# Patient Record
Sex: Female | Born: 1945 | Race: White | Hispanic: No | Smoking: Former smoker
Health system: Southern US, Community
[De-identification: ages and names within clinical notes are randomized; demographics above are authoritative.]

## PROBLEM LIST (undated history)

## (undated) DIAGNOSIS — E785 Hyperlipidemia, unspecified: Secondary | ICD-10-CM

## (undated) HISTORY — PX: VEIN SURGERY: SHX48

## (undated) HISTORY — PX: JOINT REPLACEMENT: SHX530

## (undated) HISTORY — PX: OTHER SURGICAL HISTORY: SHX169

## (undated) HISTORY — DX: Hyperlipidemia, unspecified: E78.5

---

## 1997-12-27 ENCOUNTER — Other Ambulatory Visit: Admission: RE | Admit: 1997-12-27 | Discharge: 1997-12-27 | Payer: Self-pay | Admitting: Obstetrics & Gynecology

## 1999-02-14 ENCOUNTER — Encounter (INDEPENDENT_AMBULATORY_CARE_PROVIDER_SITE_OTHER): Payer: Self-pay

## 1999-02-14 ENCOUNTER — Other Ambulatory Visit: Admission: RE | Admit: 1999-02-14 | Discharge: 1999-02-14 | Payer: Self-pay | Admitting: Obstetrics and Gynecology

## 1999-02-19 ENCOUNTER — Encounter: Payer: Self-pay | Admitting: Obstetrics and Gynecology

## 1999-02-19 ENCOUNTER — Ambulatory Visit (HOSPITAL_COMMUNITY): Admission: RE | Admit: 1999-02-19 | Discharge: 1999-02-19 | Payer: Self-pay | Admitting: Obstetrics and Gynecology

## 2000-03-25 ENCOUNTER — Other Ambulatory Visit: Admission: RE | Admit: 2000-03-25 | Discharge: 2000-03-25 | Payer: Self-pay | Admitting: Obstetrics and Gynecology

## 2001-05-12 ENCOUNTER — Other Ambulatory Visit: Admission: RE | Admit: 2001-05-12 | Discharge: 2001-05-12 | Payer: Self-pay | Admitting: Obstetrics and Gynecology

## 2002-05-26 ENCOUNTER — Other Ambulatory Visit: Admission: RE | Admit: 2002-05-26 | Discharge: 2002-05-26 | Payer: Self-pay | Admitting: Obstetrics and Gynecology

## 2003-06-22 ENCOUNTER — Other Ambulatory Visit: Admission: RE | Admit: 2003-06-22 | Discharge: 2003-06-22 | Payer: Self-pay | Admitting: Obstetrics and Gynecology

## 2003-12-27 ENCOUNTER — Ambulatory Visit: Payer: Self-pay | Admitting: Family Medicine

## 2004-11-04 ENCOUNTER — Ambulatory Visit: Payer: Self-pay | Admitting: Internal Medicine

## 2004-11-12 ENCOUNTER — Ambulatory Visit: Payer: Self-pay | Admitting: Internal Medicine

## 2004-12-16 ENCOUNTER — Other Ambulatory Visit: Admission: RE | Admit: 2004-12-16 | Discharge: 2004-12-16 | Payer: Self-pay | Admitting: Obstetrics and Gynecology

## 2009-03-27 ENCOUNTER — Ambulatory Visit (HOSPITAL_BASED_OUTPATIENT_CLINIC_OR_DEPARTMENT_OTHER): Admission: RE | Admit: 2009-03-27 | Discharge: 2009-03-27 | Payer: Self-pay | Admitting: Orthopedic Surgery

## 2009-11-08 ENCOUNTER — Encounter
Admission: RE | Admit: 2009-11-08 | Discharge: 2009-12-06 | Payer: Self-pay | Source: Home / Self Care | Admitting: Family Medicine

## 2010-03-13 ENCOUNTER — Other Ambulatory Visit: Payer: Self-pay | Admitting: Family Medicine

## 2010-03-13 ENCOUNTER — Other Ambulatory Visit (HOSPITAL_COMMUNITY)
Admission: RE | Admit: 2010-03-13 | Discharge: 2010-03-13 | Disposition: A | Discharge status: Home / Self Care | Payer: BC Managed Care – PPO | Source: Ambulatory Visit | Attending: Family Medicine | Admitting: Family Medicine

## 2010-03-13 DIAGNOSIS — Z124 Encounter for screening for malignant neoplasm of cervix: Secondary | ICD-10-CM | POA: Insufficient documentation

## 2010-04-17 ENCOUNTER — Other Ambulatory Visit: Payer: Self-pay | Admitting: Family Medicine

## 2010-04-17 DIAGNOSIS — M5416 Radiculopathy, lumbar region: Secondary | ICD-10-CM

## 2010-04-19 ENCOUNTER — Ambulatory Visit
Admission: RE | Admit: 2010-04-19 | Discharge: 2010-04-19 | Disposition: A | Discharge status: Home / Self Care | Payer: BC Managed Care – PPO | Source: Ambulatory Visit | Attending: Family Medicine | Admitting: Family Medicine

## 2010-04-19 DIAGNOSIS — M5416 Radiculopathy, lumbar region: Secondary | ICD-10-CM

## 2010-04-19 MED ORDER — GADOBENATE DIMEGLUMINE 529 MG/ML IV SOLN
15.0000 mL | Freq: Once | INTRAVENOUS | Status: AC | PRN
  Administered 2010-04-19: 15 mL via INTRAVENOUS

## 2010-05-02 LAB — POCT HEMOGLOBIN-HEMACUE: Hemoglobin: 15.1 g/dL — ABNORMAL HIGH (ref 12.0–15.0)

## 2010-09-13 ENCOUNTER — Other Ambulatory Visit: Payer: Self-pay | Admitting: Orthopedic Surgery

## 2010-09-13 ENCOUNTER — Ambulatory Visit (HOSPITAL_COMMUNITY)
Admission: RE | Admit: 2010-09-13 | Discharge: 2010-09-13 | Disposition: A | Discharge status: Home / Self Care | Payer: BC Managed Care – PPO | Source: Ambulatory Visit | Attending: Orthopedic Surgery | Admitting: Orthopedic Surgery

## 2010-09-13 ENCOUNTER — Encounter (HOSPITAL_COMMUNITY): Payer: BC Managed Care – PPO

## 2010-09-13 ENCOUNTER — Other Ambulatory Visit (HOSPITAL_COMMUNITY): Payer: Self-pay | Admitting: *Deleted

## 2010-09-13 DIAGNOSIS — Z01818 Encounter for other preprocedural examination: Secondary | ICD-10-CM | POA: Insufficient documentation

## 2010-09-13 DIAGNOSIS — M169 Osteoarthritis of hip, unspecified: Secondary | ICD-10-CM | POA: Insufficient documentation

## 2010-09-13 DIAGNOSIS — M47817 Spondylosis without myelopathy or radiculopathy, lumbosacral region: Secondary | ICD-10-CM | POA: Insufficient documentation

## 2010-09-13 DIAGNOSIS — M1610 Unilateral primary osteoarthritis, unspecified hip: Secondary | ICD-10-CM | POA: Insufficient documentation

## 2010-09-13 DIAGNOSIS — Z01812 Encounter for preprocedural laboratory examination: Secondary | ICD-10-CM | POA: Insufficient documentation

## 2010-09-13 DIAGNOSIS — M25559 Pain in unspecified hip: Secondary | ICD-10-CM | POA: Insufficient documentation

## 2010-09-13 LAB — COMPREHENSIVE METABOLIC PANEL
ALT: 24 U/L (ref 0–35)
AST: 19 U/L (ref 0–37)
CO2: 26 mEq/L (ref 19–32)
Calcium: 9.9 mg/dL (ref 8.4–10.5)
Chloride: 102 mEq/L (ref 96–112)
GFR calc non Af Amer: >60 mL/min (ref >60)
Potassium: 4.1 mEq/L (ref 3.5–5.1)
Sodium: 138 mEq/L (ref 135–145)
Total Bilirubin: 0.3 mg/dL (ref 0.3–1.2)

## 2010-09-13 LAB — URINALYSIS, ROUTINE W REFLEX MICROSCOPIC
Bilirubin Urine: NEGATIVE
Glucose, UA: NEGATIVE mg/dL
Hgb urine dipstick: NEGATIVE
Specific Gravity, Urine: 1.012 (ref 1.005–1.030)
pH: 6.5 (ref 5.0–8.0)

## 2010-09-13 LAB — CBC
HCT: 41.6 % (ref 36.0–46.0)
MCHC: 33.7 g/dL (ref 30.0–36.0)
MCV: 94.3 fL (ref 78.0–100.0)
RDW: 13.4 % (ref 11.5–15.5)

## 2010-09-13 LAB — URINE MICROSCOPIC-ADD ON

## 2010-09-23 ENCOUNTER — Inpatient Hospital Stay (HOSPITAL_COMMUNITY)
Admission: RE | Admit: 2010-09-23 | Discharge: 2010-09-26 | DRG: 818 | Disposition: A | Discharge status: Home Health | Payer: BC Managed Care – PPO | Source: Ambulatory Visit | Attending: Orthopedic Surgery | Admitting: Orthopedic Surgery

## 2010-09-23 ENCOUNTER — Inpatient Hospital Stay (HOSPITAL_COMMUNITY): Payer: BC Managed Care – PPO

## 2010-09-23 DIAGNOSIS — M1610 Unilateral primary osteoarthritis, unspecified hip: Principal | ICD-10-CM | POA: Diagnosis present

## 2010-09-23 DIAGNOSIS — Z01812 Encounter for preprocedural laboratory examination: Secondary | ICD-10-CM

## 2010-09-23 DIAGNOSIS — R030 Elevated blood-pressure reading, without diagnosis of hypertension: Secondary | ICD-10-CM | POA: Diagnosis not present

## 2010-09-23 DIAGNOSIS — M169 Osteoarthritis of hip, unspecified: Principal | ICD-10-CM | POA: Diagnosis present

## 2010-09-23 DIAGNOSIS — Z791 Long term (current) use of non-steroidal anti-inflammatories (NSAID): Secondary | ICD-10-CM

## 2010-09-23 LAB — ABO/RH: ABO/RH(D): B POS

## 2010-09-23 LAB — TYPE AND SCREEN: ABO/RH(D): B POS

## 2010-09-24 LAB — BASIC METABOLIC PANEL
BUN: 10 mg/dL (ref 6–23)
Chloride: 97 mEq/L (ref 96–112)
GFR calc Af Amer: >60 mL/min (ref >60)
Potassium: 4 mEq/L (ref 3.5–5.1)

## 2010-09-24 LAB — CBC
HCT: 34 % — ABNORMAL LOW (ref 36.0–46.0)
RDW: 13.3 % (ref 11.5–15.5)
WBC: 10.4 10*3/uL (ref 4.0–10.5)

## 2010-09-25 LAB — BASIC METABOLIC PANEL
BUN: 8 mg/dL (ref 6–23)
CO2: 28 mEq/L (ref 19–32)
Chloride: 104 mEq/L (ref 96–112)
Glucose, Bld: 124 mg/dL — ABNORMAL HIGH (ref 70–99)
Potassium: 4.1 mEq/L (ref 3.5–5.1)

## 2010-09-25 LAB — CBC
HCT: 30.3 % — ABNORMAL LOW (ref 36.0–46.0)
Hemoglobin: 10.4 g/dL — ABNORMAL LOW (ref 12.0–15.0)
MCHC: 34.3 g/dL (ref 30.0–36.0)
RBC: 3.2 MIL/uL — ABNORMAL LOW (ref 3.87–5.11)
WBC: 9.9 10*3/uL (ref 4.0–10.5)

## 2010-09-25 NOTE — H&P (Signed)
  [name] NO.:  [id number]  MEDICAL RECORD NO.:  [id number]  LOCATION:                                 FACILITY:  PHYSICIAN:  Ollen Gross, M.D.    DATE OF BIRTH:  11-05-1945  DATE OF ADMISSION:  09/23/2010 DATE OF DISCHARGE:                             HISTORY & PHYSICAL   CHIEF COMPLAINT:  Right hip pain.  HISTORY OF PRESENT ILLNESS:  The patient is a 65 year old female who has been seen by Dr. Lequita Halt for ongoing right hip pain.  She has known end- stage arthritis, pain has progressively gotten worse, but she would benefit from undergoing surgical intervention.  Risks and benefits have been discussed and she subsequently admitted to the hospital.  ALLERGIES:  No known drug allergies.  CURRENT MEDICATIONS:  Tylenol and Aleve.  PAST MEDICAL HISTORY:  Mild borderline hypertension.  PAST SURGICAL HISTORY:  Carpal tunnel surgery, varicose vein surgery.  FAMILY HISTORY:  Father deceased in his 31s, natural causes.  Mother deceased in her 32s, natural causes.  SOCIAL HISTORY:  Widow, retired, past smoker, 2 glasses of wine daily, 4 children, lives alone.  She will have caregiver lined up.  She does have a living will health care power of attorney.  REVIEW OF SYSTEMS:  GENERAL:  No fever, chills, or night sweats.  NEURO: No seizure, syncope, or paralysis.  RESPIRATORY:  No shortness of breath, productive cough, or hemoptysis.  CARDIOVASCULAR:  No chest pain, angina, or orthopnea.  GI:  No nausea, vomiting, diarrhea, or constipation.  GU:  No dysuria, hematuria, or discharge. MUSCULOSKELETAL:  Hip pain.  PHYSICAL EXAMINATION:  VITAL SIGNS:  Pulse 68, respirations 12, blood pressure 152/82. GENERAL:  A 65 year old white female, well nourished, well developed, in no acute distress.  She is alert, oriented, cooperative, pleasant, good historian. HEENT:  Normocephalic, atraumatic.  Pupils are round and reactive.  EOMs intact.  She does have  contacts. NECK:  Supple. CHEST:  Clear anterior posterior chest walls. HEART:  Regular rate and rhythm without murmur. ABDOMEN:  Soft, nontender.  Bowel sounds present. RECTAL/BREASTS/GENITALIA:  Not done, not pertinent to present illness. EXTREMITIES:  Right hip flexion with internal rotation 0, external rotation 10, abduction about 10-20 degrees.  Left hip, flexion 110 internal rotation 20, external rotation 30, abduction 30.  IMPRESSION:  Osteoarthritis, right hip.  PLAN:  The patient admitted to [facility] to undergo right total hip replacement arthroplasty.  Surgery will be performed by Dr. Ollen Gross.     Alexzandrew L. Julien Girt, P.A.C.   ______________________________ Ollen Gross, M.D.    ALP/MEDQ  D:  09/22/2010  T:  09/22/2010  Job:  [id number]  cc:   Otilio Connors. Gerri Spore, M.D. Fax: [phone]  Electronically Signed by Patrica Duel P.A.C. on 09/24/2010 10:12:29 AM Electronically Signed by Ollen Gross M.D. on 09/25/2010 10:30:31 AM

## 2010-09-25 NOTE — Op Note (Signed)
[name], [name] NO.:  [id number]  MEDICAL RECORD NO.:  [id number]  LOCATION:  1610                         FACILITY:  [facility]  PHYSICIAN:  Ollen Gross, M.D.    DATE OF BIRTH:  [birth year]  DATE OF PROCEDURE:  09/23/2010 DATE OF DISCHARGE:                              OPERATIVE REPORT   PREOPERATIVE DIAGNOSIS:  Osteoarthritis, right hip.  POSTOPERATIVE DIAGNOSIS:  Osteoarthritis, right hip.  PROCEDURE:  Right total hip arthroplasty.  SURGEON:  Ollen Gross, M.D.  ASSISTANT:  Alexzandrew L. Perkins, P.A.C.  ANESTHESIA:  Spinal.  ESTIMATED BLOOD LOSS:  200 during Hemovac x1.  COMPLICATIONS:  None.  CONDITION:  Stable to the recovery room.  BRIEF CLINICAL NOTE:  [name] is a 65 year old female with end-stage arthritis of the right hip with progressively worsening pain and dysfunction.  She has failed nonoperative management and presents for right total hip arthroplasty.  She has bone-on-bone arthritis of the hip with intractable pain and dysfunction.  PROCEDURE IN DETAIL:  After successful administration of spinal anesthetic, the patient was placed in the left lateral decubitus position with the right side up and held with the hip positioner.  Right lower extremity was isolated from her perineum with plastic drapes and prepped and draped in the usual sterile fashion.  Short posterolateral incision was made with a #10-blade through the subcutaneous tissue to the level of fascia lata, which was incised in line with the skin incision.  The sciatic nerve was palpated and protected and the short rotators and capsule were isolated off the femur.  Hip is dislocated and the center of femoral head is marked.  A trial prosthesis is placed such that the center of the trial head corresponds to the center of her native femoral head.  Osteotomy line is marked on the femoral neck and osteotomy made with an oscillating saw.  The femoral head is  then removed.  Femoral retractors are placed to gain access to the proximal femur.  Starter reamer was passed into the femoral canal.  The canal was thoroughly irrigated to remove fatty contents.  Axial reaming is then performed at 13.5 mm.  Proximal reaming is performed to an 60F and the sleeve is machined to a small.  An 60F small trial sleeve was placed.  The femur was then retracted anteriorly to gain acetabular exposure. Acetabular retractors were placed and labrum and osteophytes are removed.  Acetabular reaming is performed up to 51 mm for placement of a 52-mm pinnacle acetabular shell.  This was placed in an anatomic position with outstanding purchase.  We did not need dome screws.  Apex hole eliminator was placed, and a 32-mm neutral +4 marathon liner was placed.  The trial stem is then placed, which is an 18 x 30 with a 36 +8 neck matching native anteversion.  A 32 +0 trial head is placed.  Hips reduced with outstanding stability.  As full extension, flexion, rotation, 70 degrees of flexion, 40 degrees of adduction, 90 degrees of internal rotation, 90 degrees of flexion, and 70 degrees of internal rotation.  By placing the right leg on top of the left, it felt as though leg lengths were equal.  Hips  then dislocated and trials were removed.  The permanent 46F small sleeve was placed with the 18 x 30 stem and 36 +8 neck matching native anteversion.  The 32 +0 ceramic head is placed.  The hips reduced with the same stability parameters.  The wounds copiously irrigated with saline solution and then the short rotators and capsule reattached to the femur through drill holes with Ethibond suture.  Fascia lata was closed over Hemovac drain with interrupted #1 Vicryl.  The Exparel is then mixed and 50 mL of saline and injected into the fascia lata, gluteal muscles, and the subcutaneous tissues.  Subcu was then closed with interrupted #1 and 2-0 Vicryl and subcuticular running 4-0  Monocryl.  Drains are to suction.  Incision is clean and dry and Steri-Strips and a bulky sterile dressing applied. She was then placed into a knee immobilizer, awakened and transported to recovery room in stable condition.     Ollen Gross, M.D.     FA/MEDQ  D:  09/23/2010  T:  09/24/2010  Job:  [id number]  Electronically Signed by Ollen Gross M.D. on 09/25/2010 10:30:33 AM

## 2010-09-26 LAB — CBC
HCT: 30.9 % — ABNORMAL LOW (ref 36.0–46.0)
Hemoglobin: 10.5 g/dL — ABNORMAL LOW (ref 12.0–15.0)
MCH: 31.9 pg (ref 26.0–34.0)
MCHC: 34 g/dL (ref 30.0–36.0)

## 2010-10-20 NOTE — Discharge Summary (Signed)
[name], [name] NO.:  [id number]  MEDICAL RECORD NO.:  [id number]  LOCATION:  1610                         FACILITY:  [facility]  PHYSICIAN:  Ollen Gross, M.D.    DATE OF BIRTH:  04-Sep-1945  DATE OF ADMISSION:  09/23/2010 DATE OF DISCHARGE:  09/26/2010                              DISCHARGE SUMMARY   ADMITTING DIAGNOSES: 1. Osteoarthritis, right hip. 2. Mild borderline hypertension.  DISCHARGE DIAGNOSES: 1. Osteoarthritis, right hip; status post right total hip replacement     and arthroplasty. 2. Postoperative hyponatremia improved. 3. Mild borderline hypertension.  PROCEDURE:  September 23, 2010, right total hip.  SURGEON:  Ollen Gross, M.D.  ASSISTANT:  Alexzandrew L. Perkins, P.A.C.  ANESTHESIA:  Spinal.  CONSULTS:  None.  BRIEF HISTORY:  The patient is a 65 year old female with end-stage arthritis of the right hip, progressive worsening pain and dysfunction. She has failed nonoperative management, has bone-on-bone arthritis, now presents for total hip arthroplasty.  LABORATORY DATA:  The admission CBC is not scanned into the chart, but the starting hemoglobin was 14.  Serial CBC were followed.  Hemoglobin dropped down to 11.9 and 10.4, was stabilized.  Last H and H were 10.5 and 30.9.  Chem panel on admission not scanned to the chart, not available; but the postop sodium was down to 131 and it is back up to 138.  Remaining electrolytes remained within normal limits.  Glucose was 195 on postop day #1, back down to 124 on postop day #2.  Blood group type B+.  X-rays, postop films of the right hip and the pelvis shows satisfactory right total hip arthroplasty.  No adverse features.  HOSPITAL COURSE:  The patient admitted to [facility], taken to OR, underwent above-stated procedure without complication.  The patient tolerated the procedure well, later transferred to recovery room on orthopedic floor, started on p.o. and IV analgesic  for pain control following surgery given 24 hours postop IV antibiotics.  She was started on Xarelto for DVT prophylaxis.  A little low sodium on the morning of day 1, she had some clamping also which encouraged muscle relaxants. She started back on her home meds.  Her pressure was a little soft, but she was asymptomatic with this, we supported her with fluids.  She had decent urinary output.  Hemovac drain was pulled on day 1.  By day 2, she had already started getting up with therapy.  We switched her meds around.  She was doing a little bit better on oral Dilaudid.  Hemoglobin was stable.  Sodium was already back up.  Dressing changed.  Incision looked good.  Continued to progress with physical therapy; and by day 3, she was doing well, meeting her goals and discharged home.  DISCHARGE/PLAN: 1. The patient was discharged home on September 26, 2010. 2. Discharge diagnosis:  Please see above. 3. Discharge medications:  Dilaudid, Robaxin, and Xarelto.  She     stopped all of her other home meds including Aleve and Advil.  ACTIVITY:  She is partial weightbearing 25-50% to the right lower extremity.  Hip precautions, total hip protocol.  FOLLOWUP:  2 weeks.  DISPOSITION:  Home.  CONDITION UPON DISCHARGE:  Improving.     Alexzandrew L. Julien Girt, P.A.C.   ______________________________ Ollen Gross, M.D.    ALP/MEDQ  D:  10/16/2010  T:  10/16/2010  Job:  [id number]  cc:   Otilio Connors. Gerri Spore, M.D. Fax: [phone]  Electronically Signed by Patrica Duel P.A.C. on 10/17/2010 08:14:26 AM Electronically Signed by Ollen Gross M.D. on 10/20/2010 12:16:38 PM

## 2011-10-21 ENCOUNTER — Emergency Department (HOSPITAL_COMMUNITY)
Admission: EM | Admit: 2011-10-21 | Discharge: 2011-10-21 | Disposition: A | Discharge status: Home / Self Care | Payer: Medicare Other | Attending: Emergency Medicine | Admitting: Emergency Medicine

## 2011-10-21 ENCOUNTER — Encounter (HOSPITAL_COMMUNITY): Payer: Self-pay | Admitting: Emergency Medicine

## 2011-10-21 DIAGNOSIS — Z7982 Long term (current) use of aspirin: Secondary | ICD-10-CM | POA: Insufficient documentation

## 2011-10-21 DIAGNOSIS — T7840XA Allergy, unspecified, initial encounter: Secondary | ICD-10-CM

## 2011-10-21 DIAGNOSIS — T6391XA Toxic effect of contact with unspecified venomous animal, accidental (unintentional), initial encounter: Secondary | ICD-10-CM | POA: Insufficient documentation

## 2011-10-21 DIAGNOSIS — T63461A Toxic effect of venom of wasps, accidental (unintentional), initial encounter: Secondary | ICD-10-CM | POA: Insufficient documentation

## 2011-10-21 MED ORDER — PREDNISONE 20 MG PO TABS: 60.0000 mg | ORAL_TABLET | Freq: Every day | ORAL | Status: AC

## 2011-10-21 MED ORDER — EPINEPHRINE 0.3 MG/0.3ML IJ DEVI: 0.3000 mg | INTRAMUSCULAR | Status: DC | PRN

## 2011-10-21 NOTE — ED Provider Notes (Signed)
History     CSN: [id number]  Arrival date & time 10/21/11  1238   First MD Initiated Contact with Patient 10/21/11 1429      Chief Complaint  Patient presents with  . Allergic Reaction    (Consider location/radiation/quality/duration/timing/severity/associated sxs/prior treatment) Patient is a 66 y.o. female presenting with allergic reaction. The history is provided by the patient.  Allergic Reaction The primary symptoms are  rash. The primary symptoms do not include shortness of breath, cough, nausea, vomiting or palpitations.   66 year old, female was stung right hand by a hornet.  She develops hives, itching in her extremities, and swelling of the right hand.  She went to her primary care doctor's office and was given Benadryl, and a steroid.  The hives have resolved, and the itching, has resolved.  She did state that after she was stung.  She was a little bit lightheaded.  However, she denied shortness of breath.  She denied tightness of her throat, nausea, or vomiting she has never had an allergic reaction to an insect in the past.  Presently, she is asymptomatic  History reviewed. No pertinent past medical history.  History reviewed. No pertinent past surgical history.  No family history on file.  History  Substance Use Topics  . Smoking status: Not on file  . Smokeless tobacco: Not on file  . Alcohol Use: Not on file    OB History    Grav Para Term Preterm Abortions TAB SAB Ect Mult Living                  Review of Systems  HENT: Negative for trouble swallowing and voice change.   Respiratory: Negative for cough and shortness of breath.   Cardiovascular: Negative for palpitations.  Gastrointestinal: Negative for nausea and vomiting.  Skin: Positive for rash.  Neurological: Positive for light-headedness.  All other systems reviewed and are negative.    Allergies  Review of patient's allergies indicates no known allergies.  Home Medications   Current  Outpatient Rx  Name Route Sig Dispense Refill  . ASPIRIN EC 81 MG PO TBEC Oral Take 81 mg by mouth daily.    Marland Kitchen CALCIUM CARBONATE 1250 MG PO CAPS Oral Take 1,250 mg by mouth daily.    . OMEGA-3 FATTY ACIDS 1000 MG PO CAPS Oral Take 2 g by mouth daily.    . ADULT MULTIVITAMIN W/MINERALS CH Oral Take 1 tablet by mouth daily.    Marland Kitchen PROBIOTIC & ACIDOPHILUS EX ST PO Oral Take 1 tablet by mouth daily.      BP 150/89  Pulse 75  Temp 98.2 F (36.8 C) (Oral)  Resp 16  SpO2 95%  Physical Exam  Nursing note and vitals reviewed. Constitutional: She is oriented to person, place, and time. She appears well-developed and well-nourished. No distress.  HENT:  Head: Normocephalic and atraumatic.  Mouth/Throat: Oropharynx is clear and moist.  Eyes: Conjunctivae are normal.  Neck: Normal range of motion. Neck supple.  Cardiovascular: Normal rate.   No murmur heard. Pulmonary/Chest: Effort normal and breath sounds normal. She has no wheezes.  Musculoskeletal: Normal range of motion.  Neurological: She is alert and oriented to person, place, and time.  Skin: Skin is warm and dry. No rash noted.  Psychiatric: She has a normal mood and affect. Thought content normal.    ED Course  Procedures (including critical care time) allergic  reaction to insect sting.  No signs of anaphylaxis.  Symptoms resolved after treatment with steroids,  and Benadryl  Labs Reviewed - No data to display No results found.   No diagnosis found.    MDM  Allergic reaction        Cheri Guppy, MD 10/21/11 [phone]

## 2011-10-21 NOTE — ED Notes (Signed)
Voiced understanding of instructions given 

## 2011-10-21 NOTE — ED Notes (Signed)
PER EMS- pt picked up from Dr office with c/o allergic reaction to "hornet type" of bug.  Pt reports being stung around 11a on R hand.  Hand is red and swollen presently.  Prior to pt arrival to dr's office she took benadryl, however she unaware of the dosage.  At dr's office pt was given 50mg  of Benadryl and "depo-medrol"  80mg .  Pt arrived to EMS alert and oriented and in no respiratory distress.

## 2011-10-21 NOTE — ED Notes (Signed)
ZOX:[id number]<AV> Expected date:10/21/11<BR> Expected time:12:29 PM<BR> Means of arrival:Ambulance<BR> Comments:<BR> 64yoF,allergic reaction/hives

## 2014-10-05 ENCOUNTER — Encounter: Payer: Self-pay | Admitting: Internal Medicine

## 2014-12-05 ENCOUNTER — Ambulatory Visit: Payer: Self-pay | Admitting: Orthopedic Surgery

## 2014-12-05 NOTE — Progress Notes (Signed)
Preoperative surgical orders have been place into the Epic hospital system for [name] on 12/05/2014, 5:33 PM  by Patrica DuelPERKINS, Aubree Doody for surgery on 12-27-14.  Preop Total Hip - Anterior Approach orders including IV Tylenol, and IV Decadron as long as there are no contraindications to the above medications. Claire Peacerew Consetta Cosner, PA-C

## 2014-12-15 NOTE — Patient Instructions (Addendum)
[name]  12/18/2014   Your procedure is scheduled on:   12/26/2016    [facility]  [facility]  [facility] to 3rd floor to  Short Stay Center at    130pm  Call this number if you have problems the morning of surgery [phone]   Remember: ONLY 1 PERSON MAY GO WITH YOU TO SHORT STAY TO GET  READY MORNING OF YOUR SURGERY.  Do not eat food after midnite.  May have clear liquids from 12 midnite until 0900am then nothing by mouth.       Take these medicines the morning of surgery with A SIP OF WATER: none                                You may not have any metal on your body including hair pins and              piercings  Do not wear jewelry, make-up, lotions, powders or perfumes, deodorant             Do not wear nail polish.  Do not shave  48 hours prior to surgery.               Do not bring valuables to the hospital. [facility] IS NOT             RESPONSIBLE   FOR VALUABLES.  Contacts, dentures or bridgework may not be worn into surgery.  Leave suitcase in the car. After surgery it may be brought to your room.       Special Instructions: coughing and deep breathing exercises               Please read over the following fact sheets you were given: _____________________________________________________________________             [facility] - Preparing for [facility] Before surgery, you can play an important role.  Because skin is not sterile, your skin needs to be as free of germs as possible.  You can reduce the number of germs on your skin by washing with CHG (chlorahexidine gluconate) soap before surgery.  CHG is an antiseptic cleaner which kills germs and bonds with the skin to continue killing germs even after washing. Please DO NOT use if you have an allergy to CHG or antibacterial soaps.  If your skin becomes reddened/irritated stop using the CHG and inform your nurse when you arrive at Short Stay. Do not shave  (including legs and underarms) for at least 48 hours prior to the first CHG shower.  You may shave your face/neck. Please follow these instructions carefully:  1.  Shower with CHG Soap the night before surgery and the  morning of Surgery.  2.  If you choose to wash your hair, wash your hair first as usual with your  normal  shampoo.  3.  After you shampoo, rinse your hair and body thoroughly to remove the  shampoo.                           4.  Use CHG as you would any other liquid soap.  You can apply chg directly  to the skin and wash  Gently with a scrungie or clean washcloth.  5.  Apply the CHG Soap to your body ONLY FROM THE NECK DOWN.   Do not use on face/ open                           Wound or open sores. Avoid contact with eyes, ears mouth and genitals (private parts).                       Wash face,  Genitals (private parts) with your normal soap.             6.  Wash thoroughly, paying special attention to the area where your surgery  will be performed.  7.  Thoroughly rinse your body with warm water from the neck down.  8.  DO NOT shower/wash with your normal soap after using and rinsing off  the CHG Soap.                9.  Pat yourself dry with a clean towel.            10.  Wear clean pajamas.            11.  Place clean sheets on your bed the night of your first shower and do not  sleep with pets. Day of Surgery : Do not apply any lotions/deodorants the morning of surgery.  Please wear clean clothes to the hospital/surgery center.  FAILURE TO FOLLOW THESE INSTRUCTIONS MAY RESULT IN THE CANCELLATION OF YOUR SURGERY PATIENT SIGNATURE_________________________________  NURSE SIGNATURE__________________________________  ________________________________________________________________________    CLEAR LIQUID DIET   Foods Allowed                                                                     Foods Excluded  Coffee and tea, regular and decaf                              liquids that you cannot  Plain Jell-O in any flavor                                             see through such as: Fruit ices (not with fruit pulp)                                     milk, soups, orange juice  Iced Popsicles                                    All solid food Carbonated beverages, regular and diet                                    Cranberry, grape and apple juices Sports drinks like Gatorade Lightly seasoned clear broth or consume(fat free) Sugar, honey syrup  Sample Menu Breakfast                                Lunch                                     Supper Cranberry juice                    Beef broth                            Chicken broth Jell-O                                     Grape juice                           Apple juice Coffee or tea                        Jell-O                                      Popsicle                                                Coffee or tea                        Coffee or tea  _____________________________________________________________________   WHAT IS A BLOOD TRANSFUSION? Blood Transfusion Information  A transfusion is the replacement of blood or some of its parts. Blood is made up of multiple cells which provide different functions.  Red blood cells carry oxygen and are used for blood loss replacement.  White blood cells fight against infection.  Platelets control bleeding.  Plasma helps clot blood.  Other blood products are available for specialized needs, such as hemophilia or other clotting disorders. BEFORE THE TRANSFUSION  Who gives blood for transfusions?   Healthy volunteers who are fully evaluated to make sure their blood is safe. This is blood bank blood. Transfusion therapy is the safest it has ever been in the practice of medicine. Before blood is taken from a donor, a complete history is taken to make sure that person has no history of diseases nor engages in risky social behavior  (examples are intravenous drug use or sexual activity with multiple partners). The donor's travel history is screened to minimize risk of transmitting infections, such as malaria. The donated blood is tested for signs of infectious diseases, such as HIV and hepatitis. The blood is then tested to be sure it is compatible with you in order to minimize the chance of a transfusion reaction. If you or a relative donates blood, this is often done in anticipation of surgery and is not appropriate for emergency situations. It takes many days to process the donated blood. RISKS AND COMPLICATIONS Although transfusion therapy is very safe and saves many lives, the main dangers of transfusion include:  1. Getting an infectious disease. 2. Developing a transfusion reaction. This is an allergic reaction to something in the blood you were given. Every precaution is taken to prevent this. The decision to have a blood transfusion has been considered carefully by your caregiver before blood is given. Blood is not given unless the benefits outweigh the risks. AFTER THE TRANSFUSION  Right after receiving a blood transfusion, you will usually feel much better and more energetic. This is especially true if your red blood cells have gotten low (anemic). The transfusion raises the level of the red blood cells which carry oxygen, and this usually causes an energy increase.  The nurse administering the transfusion will monitor you carefully for complications. HOME CARE INSTRUCTIONS  No special instructions are needed after a transfusion. You may find your energy is better. Speak with your caregiver about any limitations on activity for underlying diseases you may have. SEEK MEDICAL CARE IF:   Your condition is not improving after your transfusion.  You develop redness or irritation at the intravenous (IV) site. SEEK IMMEDIATE MEDICAL CARE IF:  Any of the following symptoms occur over the next 12 hours:  Shaking  chills.  You have a temperature by mouth above 102 F (38.9 C), not controlled by medicine.  Chest, back, or muscle pain.  People around you feel you are not acting correctly or are confused.  Shortness of breath or difficulty breathing.  Dizziness and fainting.  You get a rash or develop hives.  You have a decrease in urine output.  Your urine turns a dark color or changes to pink, red, or brown. Any of the following symptoms occur over the next 10 days:  You have a temperature by mouth above 102 F (38.9 C), not controlled by medicine.  Shortness of breath.  Weakness after normal activity.  The white part of the eye turns yellow (jaundice).  You have a decrease in the amount of urine or are urinating less often.  Your urine turns a dark color or changes to pink, red, or brown. Document Released: 01/25/2000 Document Revised: 04/21/2011 Document Reviewed: 09/13/2007 ExitCare Patient Information 2014 [place].  _______________________________________________________________________  Incentive Spirometer  An incentive spirometer is a tool that can help keep your lungs clear and active. This tool measures how well you are filling your lungs with each breath. Taking long deep breaths may help reverse or decrease the chance of developing breathing (pulmonary) problems (especially infection) following:  A long period of time when you are unable to move or be active. BEFORE THE PROCEDURE   If the spirometer includes an indicator to show your best effort, your nurse or respiratory therapist will set it to a desired goal.  If possible, sit up straight or lean slightly forward. Try not to slouch.  Hold the incentive spirometer in an upright position. INSTRUCTIONS FOR USE  3. Sit on the edge of your bed if possible, or sit up as far as you can in bed or on a chair. 4. Hold the incentive spirometer in an upright position. 5. Breathe out normally. 6. Place the  mouthpiece in your mouth and seal your lips tightly around it. 7. Breathe in slowly and as deeply as possible, raising the piston or the ball toward the top of the column. 8. Hold your breath for 3-5 seconds or for as long as possible. Allow the piston or ball to fall to the bottom of the column. 9. Remove the mouthpiece from your mouth and breathe out  normally. 10. Rest for a few seconds and repeat Steps 1 through 7 at least 10 times every 1-2 hours when you are awake. Take your time and take a few normal breaths between deep breaths. 11. The spirometer may include an indicator to show your best effort. Use the indicator as a goal to work toward during each repetition. 12. After each set of 10 deep breaths, practice coughing to be sure your lungs are clear. If you have an incision (the cut made at the time of surgery), support your incision when coughing by placing a pillow or rolled up towels firmly against it. Once you are able to get out of bed, walk around indoors and cough well. You may stop using the incentive spirometer when instructed by your caregiver.  RISKS AND COMPLICATIONS  Take your time so you do not get dizzy or light-headed.  If you are in pain, you may need to take or ask for pain medication before doing incentive spirometry. It is harder to take a deep breath if you are having pain. AFTER USE  Rest and breathe slowly and easily.  It can be helpful to keep track of a log of your progress. Your caregiver can provide you with a simple table to help with this. If you are using the spirometer at home, follow these instructions: [place] IF:   You are having difficultly using the spirometer.  You have trouble using the spirometer as often as instructed.  Your pain medication is not giving enough relief while using the spirometer.  You develop fever of 100.5 F (38.1 C) or higher. SEEK IMMEDIATE MEDICAL CARE IF:   You cough up bloody sputum that had not been  present before.  You develop fever of 102 F (38.9 C) or greater.  You develop worsening pain at or near the incision site. MAKE SURE YOU:   Understand these instructions.  Will watch your condition.  Will get help right away if you are not doing well or get worse. Document Released: 06/09/2006 Document Revised: 04/21/2011 Document Reviewed: 08/10/2006 [facility] Patient Information 2014 [place], [place].   ________________________________________________________________________

## 2014-12-18 ENCOUNTER — Encounter (HOSPITAL_COMMUNITY)
Admission: RE | Admit: 2014-12-18 | Discharge: 2014-12-18 | Disposition: A | Discharge status: Home / Self Care | Payer: Medicare Other | Source: Ambulatory Visit | Attending: Orthopedic Surgery | Admitting: Orthopedic Surgery

## 2014-12-18 ENCOUNTER — Encounter (HOSPITAL_COMMUNITY): Payer: Self-pay

## 2014-12-18 DIAGNOSIS — Z01818 Encounter for other preprocedural examination: Secondary | ICD-10-CM | POA: Insufficient documentation

## 2014-12-18 DIAGNOSIS — M1612 Unilateral primary osteoarthritis, left hip: Secondary | ICD-10-CM | POA: Insufficient documentation

## 2014-12-18 LAB — SURGICAL PCR SCREEN
MRSA, PCR: NEGATIVE
STAPHYLOCOCCUS AUREUS: NEGATIVE

## 2014-12-18 LAB — COMPREHENSIVE METABOLIC PANEL
ALT: 31 U/L (ref 14–54)
AST: 31 U/L (ref 15–41)
Albumin: 4.5 g/dL (ref 3.5–5.0)
Alkaline Phosphatase: 61 U/L (ref 38–126)
Anion gap: 7 (ref 5–15)
BUN: 12 mg/dL (ref 6–20)
CALCIUM: 9.9 mg/dL (ref 8.9–10.3)
CHLORIDE: 103 mmol/L (ref 101–111)
CO2: 27 mmol/L (ref 22–32)
CREATININE: 0.63 mg/dL (ref 0.44–1.00)
Glucose, Bld: 88 mg/dL (ref 65–99)
Potassium: 4.6 mmol/L (ref 3.5–5.1)
Sodium: 137 mmol/L (ref 135–145)
Total Bilirubin: 0.7 mg/dL (ref 0.3–1.2)
Total Protein: 7.4 g/dL (ref 6.5–8.1)

## 2014-12-18 LAB — URINE MICROSCOPIC-ADD ON

## 2014-12-18 LAB — CBC
HCT: 42.7 % (ref 36.0–46.0)
Hemoglobin: 14.2 g/dL (ref 12.0–15.0)
MCH: 32.1 pg (ref 26.0–34.0)
MCHC: 33.3 g/dL (ref 30.0–36.0)
MCV: 96.6 fL (ref 78.0–100.0)
PLATELETS: 347 10*3/uL (ref 150–400)
RBC: 4.42 MIL/uL (ref 3.87–5.11)
RDW: 13.4 % (ref 11.5–15.5)
WBC: 5.1 10*3/uL (ref 4.0–10.5)

## 2014-12-18 LAB — URINALYSIS, ROUTINE W REFLEX MICROSCOPIC
Bilirubin Urine: NEGATIVE
Glucose, UA: NEGATIVE mg/dL
Hgb urine dipstick: NEGATIVE
KETONES UR: NEGATIVE mg/dL
NITRITE: NEGATIVE
PH: 7.5 (ref 5.0–8.0)
PROTEIN: NEGATIVE mg/dL
Specific Gravity, Urine: 1.009 (ref 1.005–1.030)
Urobilinogen, UA: 0.2 mg/dL (ref 0.0–1.0)

## 2014-12-18 LAB — APTT: aPTT: 28 seconds (ref 24–37)

## 2014-12-18 LAB — PROTIME-INR
INR: 0.98 (ref 0.00–1.49)
PROTHROMBIN TIME: 13.2 s (ref 11.6–15.2)

## 2014-12-18 NOTE — Progress Notes (Signed)
Dr Hyman HopesWebb- 10/06/14 clearance on chart along with LOV note 10/06/14 on chart

## 2014-12-18 NOTE — Progress Notes (Signed)
U/A and micro results done 12/18/14 faxed via EPIC to Dr Lequita HaltAluisio.

## 2014-12-21 NOTE — H&P (Signed)
TOTAL HIP ADMISSION H&P  Patient is admitted for left total hip arthroplasty.  Subjective:  Chief Complaint: left hip pain  HPI: [name], 69 y.o. female, has a history of pain and functional disability in the left hip(s) due to arthritis and patient has failed non-surgical conservative treatments for greater than 12 weeks to include NSAID's and/or analgesics, corticosteriod injections and activity modification.  Onset of symptoms was gradual starting 5 years ago with gradually worsening course since that time.The patient noted no past surgery on the left hip(s).  Patient currently rates pain in the left hip at 8 out of 10 with activity. Patient has night pain, worsening of pain with activity and weight bearing, pain that interfers with activities of daily living, pain with passive range of motion and crepitus. Patient has evidence of periarticular osteophytes and joint space narrowing by imaging studies. This condition presents safety issues increasing the risk of falls. There is no current active infection.  Past Medical History Osteoarthritis Hypercholesteremia   Past Surgical History  Procedure Laterality Date  . Joint replacement      right hip   . Right carpal tunnel release     . Vein surgery       Current outpatient prescriptions:  .  acetaminophen (TYLENOL) 500 MG tablet, Take 1,000 mg by mouth 2 (two) times daily as needed for mild pain., Disp: , Rfl:  .  amoxicillin (AMOXIL) 500 MG capsule, Take 2,000 mg by mouth once as needed. Prior to dental visits, Disp: , Rfl: 1 .  aspirin EC 81 MG tablet, Take 81 mg by mouth daily., Disp: , Rfl:  .  Biotin w/ Vitamins C & E (HAIR SKIN & NAILS GUMMIES PO), Take 1 tablet by mouth daily., Disp: , Rfl:  .  calcium carbonate 1250 MG capsule, Take 2 capsules by mouth daily. , Disp: , Rfl:  .  EPINEPHrine (EPIPEN) 0.3 mg/0.3 mL DEVI, Inject 0.3 mLs (0.3 mg total) into the muscle as needed. 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 ), Disp: 2 Device, Rfl: 0 .  pravastatin (PRAVACHOL) 20 MG tablet, Take 20 mg by mouth every evening., Disp: , Rfl:   No Known Allergies  Social History  Substance Use Topics  . Smoking status: Former Games developermoker  . Smokeless tobacco: Never Used  . Alcohol Use: Yes     Comment: occasional       Review of Systems  Constitutional: Negative.   HENT: Negative.   Eyes: Negative.   Respiratory: Negative.   Cardiovascular: Negative.   Gastrointestinal: Negative.   Genitourinary: Negative.   Musculoskeletal: Positive for myalgias and joint pain. Negative for back pain, falls and neck pain.       Left hip pain  Skin: Negative.   Neurological: Negative.   Endo/Heme/Allergies: Negative.   Psychiatric/Behavioral: Negative.     Objective:  Physical Exam  Constitutional: She is oriented to person, place, and time. She appears well-developed and well-nourished. No distress.  HENT:  Head: Normocephalic and atraumatic.  Right Ear: External ear normal.  Left Ear: External ear normal.  Nose: Nose normal.  Mouth/Throat: Oropharynx is clear and moist.  Eyes: Conjunctivae and EOM are normal.  Neck: Normal range of motion. Neck supple.  Cardiovascular: Normal rate, regular rhythm, normal heart sounds and intact distal pulses.   No murmur heard. Respiratory: Effort normal and breath sounds normal. No respiratory distress. She has no wheezes.  GI: Soft. Bowel sounds are  normal. She exhibits no distension. There is no tenderness.  Musculoskeletal:       Right hip: Normal.       Left hip: She exhibits decreased range of motion and crepitus.       Right knee: Normal.       Left knee: Normal.  Left hip flexion 110, 0 internal rotation with 10 degrees of external rotation and limited about 20 to 25 degrees of abduction. She does have pain elicited on attempted rotation of the hip.   Neurological: She is alert and oriented to person, place,  and time. She has normal strength and normal reflexes. No sensory deficit.  Skin: No rash noted. She is not diaphoretic. No erythema.  Psychiatric: She has a normal mood and affect. Her behavior is normal.    Vitals Weight: 158 lb Height: 64in Body Surface Area: 1.77 m Body Mass Index: 27.12 kg/m  Pulse: 72 (Regular)  BP: 144/88 (Sitting, Left Arm, Standard)  Imaging Review Plain radiographs demonstrate severe degenerative joint disease of the left hip(s). The bone quality appears to be good for age and reported activity level.  Assessment/Plan:  End stage primary osteoarthritis, left hip(s)  The patient history, physical examination, clinical judgement of the provider and imaging studies are consistent with end stage degenerative joint disease of the left hip(s) and total hip arthroplasty is deemed medically necessary. The treatment options including medical management, injection therapy, arthroscopy and arthroplasty were discussed at length. The risks and benefits of total hip arthroplasty were presented and reviewed. The risks due to aseptic loosening, infection, stiffness, dislocation/subluxation,  thromboembolic complications and other imponderables were discussed.  The patient acknowledged the explanation, agreed to proceed with the plan and consent was signed. Patient is being admitted for inpatient treatment for surgery, pain control, PT, OT, prophylactic antibiotics, VTE prophylaxis, progressive ambulation and ADL's and discharge planning.The patient is planning to be discharged home with home health services    PCP: Dr. Thalia Bloodgood, PA-C

## 2014-12-27 ENCOUNTER — Inpatient Hospital Stay (HOSPITAL_COMMUNITY): Payer: Medicare Other

## 2014-12-27 ENCOUNTER — Inpatient Hospital Stay (HOSPITAL_COMMUNITY): Payer: Medicare Other | Admitting: Certified Registered Nurse Anesthetist

## 2014-12-27 ENCOUNTER — Encounter (HOSPITAL_COMMUNITY): Payer: Self-pay

## 2014-12-27 ENCOUNTER — Inpatient Hospital Stay (HOSPITAL_COMMUNITY)
Admission: RE | Admit: 2014-12-27 | Discharge: 2014-12-28 | DRG: 470 | Disposition: A | Discharge status: Home Health | Payer: Medicare Other | Source: Ambulatory Visit | Attending: Orthopedic Surgery | Admitting: Orthopedic Surgery

## 2014-12-27 ENCOUNTER — Encounter (HOSPITAL_COMMUNITY)
Admission: RE | Disposition: A | Discharge status: Home Health | Payer: Self-pay | Source: Ambulatory Visit | Attending: Orthopedic Surgery

## 2014-12-27 DIAGNOSIS — E7800 Pure hypercholesterolemia, unspecified: Secondary | ICD-10-CM | POA: Diagnosis present

## 2014-12-27 DIAGNOSIS — Z79899 Other long term (current) drug therapy: Secondary | ICD-10-CM | POA: Diagnosis not present

## 2014-12-27 DIAGNOSIS — Z87891 Personal history of nicotine dependence: Secondary | ICD-10-CM | POA: Diagnosis not present

## 2014-12-27 DIAGNOSIS — M1612 Unilateral primary osteoarthritis, left hip: Principal | ICD-10-CM | POA: Diagnosis present

## 2014-12-27 DIAGNOSIS — Z7982 Long term (current) use of aspirin: Secondary | ICD-10-CM | POA: Diagnosis not present

## 2014-12-27 DIAGNOSIS — Z96641 Presence of right artificial hip joint: Secondary | ICD-10-CM | POA: Diagnosis present

## 2014-12-27 DIAGNOSIS — Z01812 Encounter for preprocedural laboratory examination: Secondary | ICD-10-CM

## 2014-12-27 DIAGNOSIS — M25552 Pain in left hip: Secondary | ICD-10-CM | POA: Diagnosis present

## 2014-12-27 DIAGNOSIS — Z96649 Presence of unspecified artificial hip joint: Secondary | ICD-10-CM

## 2014-12-27 DIAGNOSIS — M169 Osteoarthritis of hip, unspecified: Secondary | ICD-10-CM | POA: Diagnosis present

## 2014-12-27 HISTORY — PX: TOTAL HIP ARTHROPLASTY: SHX124

## 2014-12-27 LAB — TYPE AND SCREEN
ABO/RH(D): B POS
Antibody Screen: NEGATIVE

## 2014-12-27 SURGERY — ARTHROPLASTY, HIP, TOTAL, ANTERIOR APPROACH
Anesthesia: Spinal | Site: Hip | Laterality: Left

## 2014-12-27 MED ORDER — ONDANSETRON HCL 4 MG/2ML IJ SOLN
INTRAMUSCULAR | Status: DC | PRN
  Administered 2014-12-27: 4 mg via INTRAVENOUS

## 2014-12-27 MED ORDER — HYDROMORPHONE HCL 1 MG/ML IJ SOLN
0.2500 mg | INTRAMUSCULAR | Status: DC | PRN
  Administered 2014-12-27 (×2): 0.5 mg via INTRAVENOUS

## 2014-12-27 MED ORDER — LACTATED RINGERS IV SOLN
INTRAVENOUS | Status: DC
  Administered 2014-12-27 (×2): via INTRAVENOUS
  Administered 2014-12-27: 1000 mL via INTRAVENOUS

## 2014-12-27 MED ORDER — BISACODYL 10 MG RE SUPP: 10.0000 mg | Freq: Every day | RECTAL | Status: DC | PRN

## 2014-12-27 MED ORDER — METHOCARBAMOL 500 MG PO TABS
500.0000 mg | ORAL_TABLET | Freq: Four times a day (QID) | ORAL | Status: DC | PRN
  Administered 2014-12-28: 500 mg via ORAL
  Filled 2014-12-27: qty 1

## 2014-12-27 MED ORDER — ONDANSETRON HCL 4 MG/2ML IJ SOLN
INTRAMUSCULAR | Status: AC
  Filled 2014-12-27: qty 2

## 2014-12-27 MED ORDER — CEFAZOLIN SODIUM-DEXTROSE 2-3 GM-% IV SOLR
2.0000 g | INTRAVENOUS | Status: AC
  Administered 2014-12-27: 2 g via INTRAVENOUS

## 2014-12-27 MED ORDER — FENTANYL CITRATE (PF) 100 MCG/2ML IJ SOLN
INTRAMUSCULAR | Status: AC
  Filled 2014-12-27: qty 2

## 2014-12-27 MED ORDER — MORPHINE SULFATE (PF) 2 MG/ML IV SOLN: 1.0000 mg | INTRAVENOUS | Status: DC | PRN

## 2014-12-27 MED ORDER — LACTATED RINGERS IV SOLN: INTRAVENOUS | Status: DC

## 2014-12-27 MED ORDER — POLYETHYLENE GLYCOL 3350 17 G PO PACK: 17.0000 g | PACK | Freq: Every day | ORAL | Status: DC | PRN

## 2014-12-27 MED ORDER — FLEET ENEMA 7-19 GM/118ML RE ENEM: 1.0000 | ENEMA | Freq: Once | RECTAL | Status: DC | PRN

## 2014-12-27 MED ORDER — PROPOFOL 500 MG/50ML IV EMUL
INTRAVENOUS | Status: DC | PRN
  Administered 2014-12-27: 75 ug/kg/min via INTRAVENOUS

## 2014-12-27 MED ORDER — ACETAMINOPHEN 10 MG/ML IV SOLN
1000.0000 mg | Freq: Once | INTRAVENOUS | Status: AC
Start: 2014-12-27 — End: 2014-12-27
  Administered 2014-12-27: 1000 mg via INTRAVENOUS

## 2014-12-27 MED ORDER — ONDANSETRON HCL 4 MG PO TABS: 4.0000 mg | ORAL_TABLET | Freq: Four times a day (QID) | ORAL | Status: DC | PRN

## 2014-12-27 MED ORDER — METOCLOPRAMIDE HCL 5 MG/ML IJ SOLN: 5.0000 mg | Freq: Three times a day (TID) | INTRAMUSCULAR | Status: DC | PRN

## 2014-12-27 MED ORDER — CEFAZOLIN SODIUM-DEXTROSE 2-3 GM-% IV SOLR
INTRAVENOUS | Status: AC
  Filled 2014-12-27: qty 50

## 2014-12-27 MED ORDER — DEXAMETHASONE SODIUM PHOSPHATE 10 MG/ML IJ SOLN
INTRAMUSCULAR | Status: DC | PRN
Start: 2014-12-27 — End: 2014-12-27
  Administered 2014-12-27: 10 mg via INTRAVENOUS

## 2014-12-27 MED ORDER — MIDAZOLAM HCL 2 MG/2ML IJ SOLN
INTRAMUSCULAR | Status: AC
  Filled 2014-12-27: qty 2

## 2014-12-27 MED ORDER — MIDAZOLAM HCL 5 MG/5ML IJ SOLN
INTRAMUSCULAR | Status: DC | PRN
  Administered 2014-12-27: 2 mg via INTRAVENOUS

## 2014-12-27 MED ORDER — DEXTROSE-NACL 5-0.9 % IV SOLN: INTRAVENOUS | Status: DC

## 2014-12-27 MED ORDER — HYDROMORPHONE HCL 1 MG/ML IJ SOLN
INTRAMUSCULAR | Status: AC
  Filled 2014-12-27: qty 1

## 2014-12-27 MED ORDER — MENTHOL 3 MG MT LOZG: 1.0000 | LOZENGE | OROMUCOSAL | Status: DC | PRN

## 2014-12-27 MED ORDER — METHOCARBAMOL 1000 MG/10ML IJ SOLN
500.0000 mg | Freq: Four times a day (QID) | INTRAVENOUS | Status: DC | PRN
  Administered 2014-12-27: 500 mg via INTRAVENOUS
  Filled 2014-12-27 (×2): qty 5

## 2014-12-27 MED ORDER — PROPOFOL 10 MG/ML IV BOLUS
INTRAVENOUS | Status: AC
  Filled 2014-12-27: qty 40

## 2014-12-27 MED ORDER — ACETAMINOPHEN 650 MG RE SUPP: 650.0000 mg | Freq: Four times a day (QID) | RECTAL | Status: DC | PRN

## 2014-12-27 MED ORDER — BUPIVACAINE HCL (PF) 0.25 % IJ SOLN
INTRAMUSCULAR | Status: DC | PRN
  Administered 2014-12-27: 30 mL

## 2014-12-27 MED ORDER — PROPOFOL 10 MG/ML IV BOLUS
INTRAVENOUS | Status: DC | PRN
  Administered 2014-12-27 (×2): 20 mg via INTRAVENOUS
  Administered 2014-12-27 (×2): 10 mg via INTRAVENOUS
  Administered 2014-12-27: 20 mg via INTRAVENOUS
  Administered 2014-12-27: 10 mg via INTRAVENOUS

## 2014-12-27 MED ORDER — PROPOFOL 10 MG/ML IV BOLUS
INTRAVENOUS | Status: AC
  Filled 2014-12-27: qty 20

## 2014-12-27 MED ORDER — METOCLOPRAMIDE HCL 10 MG PO TABS: 5.0000 mg | ORAL_TABLET | Freq: Three times a day (TID) | ORAL | Status: DC | PRN

## 2014-12-27 MED ORDER — FENTANYL CITRATE (PF) 100 MCG/2ML IJ SOLN
INTRAMUSCULAR | Status: DC | PRN
  Administered 2014-12-27 (×2): 50 ug via INTRAVENOUS

## 2014-12-27 MED ORDER — PHENYLEPHRINE 40 MCG/ML (10ML) SYRINGE FOR IV PUSH (FOR BLOOD PRESSURE SUPPORT)
PREFILLED_SYRINGE | INTRAVENOUS | Status: AC
  Filled 2014-12-27: qty 20

## 2014-12-27 MED ORDER — OXYCODONE HCL 5 MG PO TABS
5.0000 mg | ORAL_TABLET | ORAL | Status: DC | PRN
  Administered 2014-12-27: 10 mg via ORAL
  Administered 2014-12-28 (×2): 5 mg via ORAL
  Filled 2014-12-27 (×2): qty 1
  Filled 2014-12-27: qty 2

## 2014-12-27 MED ORDER — ACETAMINOPHEN 500 MG PO TABS
1000.0000 mg | ORAL_TABLET | Freq: Four times a day (QID) | ORAL | Status: DC
  Administered 2014-12-27 – 2014-12-28 (×3): 1000 mg via ORAL
  Filled 2014-12-27 (×4): qty 2

## 2014-12-27 MED ORDER — ACETAMINOPHEN 325 MG PO TABS: 650.0000 mg | ORAL_TABLET | Freq: Four times a day (QID) | ORAL | Status: DC | PRN

## 2014-12-27 MED ORDER — PHENYLEPHRINE HCL 10 MG/ML IJ SOLN
INTRAMUSCULAR | Status: DC | PRN
  Administered 2014-12-27: 80 ug via INTRAVENOUS
  Administered 2014-12-27: 40 ug via INTRAVENOUS
  Administered 2014-12-27: 80 ug via INTRAVENOUS
  Administered 2014-12-27: 40 ug via INTRAVENOUS
  Administered 2014-12-27: 80 ug via INTRAVENOUS
  Administered 2014-12-27 (×2): 40 ug via INTRAVENOUS
  Administered 2014-12-27: 80 ug via INTRAVENOUS

## 2014-12-27 MED ORDER — ONDANSETRON HCL 4 MG/2ML IJ SOLN: 4.0000 mg | Freq: Four times a day (QID) | INTRAMUSCULAR | Status: DC | PRN

## 2014-12-27 MED ORDER — PRAVASTATIN SODIUM 20 MG PO TABS
20.0000 mg | ORAL_TABLET | Freq: Every evening | ORAL | Status: DC
  Administered 2014-12-27: 20 mg via ORAL
  Filled 2014-12-27 (×2): qty 1

## 2014-12-27 MED ORDER — DOCUSATE SODIUM 100 MG PO CAPS
100.0000 mg | ORAL_CAPSULE | Freq: Two times a day (BID) | ORAL | Status: DC
  Administered 2014-12-27 – 2014-12-28 (×2): 100 mg via ORAL

## 2014-12-27 MED ORDER — TRAMADOL HCL 50 MG PO TABS: 50.0000 mg | ORAL_TABLET | Freq: Four times a day (QID) | ORAL | Status: DC | PRN

## 2014-12-27 MED ORDER — EPHEDRINE SULFATE 50 MG/ML IJ SOLN
INTRAMUSCULAR | Status: DC | PRN
  Administered 2014-12-27: 5 mg via INTRAVENOUS
  Administered 2014-12-27: 10 mg via INTRAVENOUS
  Administered 2014-12-27: 5 mg via INTRAVENOUS

## 2014-12-27 MED ORDER — ACETAMINOPHEN 10 MG/ML IV SOLN
INTRAVENOUS | Status: AC
  Filled 2014-12-27: qty 100

## 2014-12-27 MED ORDER — KETOROLAC TROMETHAMINE 15 MG/ML IJ SOLN: 7.5000 mg | Freq: Four times a day (QID) | INTRAMUSCULAR | Status: AC | PRN

## 2014-12-27 MED ORDER — BUPIVACAINE HCL (PF) 0.25 % IJ SOLN
INTRAMUSCULAR | Status: AC
  Filled 2014-12-27: qty 30

## 2014-12-27 MED ORDER — DEXAMETHASONE SODIUM PHOSPHATE 10 MG/ML IJ SOLN
10.0000 mg | Freq: Once | INTRAMUSCULAR | Status: AC
  Administered 2014-12-28: 10 mg via INTRAVENOUS
  Filled 2014-12-27: qty 1

## 2014-12-27 MED ORDER — BUPIVACAINE HCL (PF) 0.5 % IJ SOLN
INTRAMUSCULAR | Status: DC | PRN
  Administered 2014-12-27: 3 mL

## 2014-12-27 MED ORDER — PHENOL 1.4 % MT LIQD
1.0000 | OROMUCOSAL | Status: DC | PRN
  Filled 2014-12-27: qty 177

## 2014-12-27 MED ORDER — CEFAZOLIN SODIUM-DEXTROSE 2-3 GM-% IV SOLR
2.0000 g | Freq: Four times a day (QID) | INTRAVENOUS | Status: AC
  Administered 2014-12-27 – 2014-12-28 (×2): 2 g via INTRAVENOUS
  Filled 2014-12-27 (×2): qty 50

## 2014-12-27 MED ORDER — TRANEXAMIC ACID 1000 MG/10ML IV SOLN
1000.0000 mg | INTRAVENOUS | Status: AC
  Administered 2014-12-27: 1000 mg via INTRAVENOUS
  Filled 2014-12-27: qty 10

## 2014-12-27 MED ORDER — DIPHENHYDRAMINE HCL 12.5 MG/5ML PO ELIX: 12.5000 mg | ORAL_SOLUTION | ORAL | Status: DC | PRN

## 2014-12-27 MED ORDER — RIVAROXABAN 10 MG PO TABS
10.0000 mg | ORAL_TABLET | Freq: Every day | ORAL | Status: DC
  Administered 2014-12-28: 10 mg via ORAL
  Filled 2014-12-27 (×2): qty 1

## 2014-12-27 SURGICAL SUPPLY — 35 items
BAG DECANTER FOR FLEXI CONT (MISCELLANEOUS) ×1 IMPLANT
BAG SPEC THK2 15X12 ZIP CLS (MISCELLANEOUS)
BAG ZIPLOCK 12X15 (MISCELLANEOUS) IMPLANT
BLADE SAG 18X100X1.27 (BLADE) ×3 IMPLANT
CAPT HIP TOTAL 2 ×2 IMPLANT
CLOSURE WOUND 1/2 X4 (GAUZE/BANDAGES/DRESSINGS) ×1
CLOTH BEACON ORANGE TIMEOUT ST (SAFETY) ×3 IMPLANT
COVER PERINEAL POST (MISCELLANEOUS) ×3 IMPLANT
DECANTER SPIKE VIAL GLASS SM (MISCELLANEOUS) ×1 IMPLANT
DRAPE STERI IOBAN 125X83 (DRAPES) ×3 IMPLANT
DRAPE U-SHAPE 47X51 STRL (DRAPES) ×6 IMPLANT
DRSG ADAPTIC 3X8 NADH LF (GAUZE/BANDAGES/DRESSINGS) ×3 IMPLANT
DRSG MEPILEX BORDER 4X4 (GAUZE/BANDAGES/DRESSINGS) ×3 IMPLANT
DRSG MEPILEX BORDER 4X8 (GAUZE/BANDAGES/DRESSINGS) ×3 IMPLANT
DURAPREP 26ML APPLICATOR (WOUND CARE) ×3 IMPLANT
ELECT REM PT RETURN 9FT ADLT (ELECTROSURGICAL) ×3
ELECTRODE REM PT RTRN 9FT ADLT (ELECTROSURGICAL) ×1 IMPLANT
EVACUATOR 1/8 PVC DRAIN (DRAIN) ×3 IMPLANT
GLOVE BIO SURGEON STRL SZ7.5 (GLOVE) ×3 IMPLANT
GLOVE BIO SURGEON STRL SZ8 (GLOVE) ×4 IMPLANT
GLOVE BIOGEL PI IND STRL 8 (GLOVE) ×2 IMPLANT
GLOVE BIOGEL PI INDICATOR 8 (GLOVE) ×4
GOWN STRL REUS W/TWL LRG LVL3 (GOWN DISPOSABLE) ×3 IMPLANT
GOWN STRL REUS W/TWL XL LVL3 (GOWN DISPOSABLE) ×3 IMPLANT
PACK ANTERIOR HIP CUSTOM (KITS) ×3 IMPLANT
STRIP CLOSURE SKIN 1/2X4 (GAUZE/BANDAGES/DRESSINGS) ×2 IMPLANT
SUT ETHIBOND NAB CT1 #1 30IN (SUTURE) ×3 IMPLANT
SUT MNCRL AB 4-0 PS2 18 (SUTURE) ×3 IMPLANT
SUT VIC AB 2-0 CT1 27 (SUTURE) ×9
SUT VIC AB 2-0 CT1 TAPERPNT 27 (SUTURE) ×2 IMPLANT
SUT VLOC 180 0 24IN GS25 (SUTURE) ×3 IMPLANT
SYR 50ML LL SCALE MARK (SYRINGE) IMPLANT
TRAY FOLEY W/METER SILVER 14FR (SET/KITS/TRAYS/PACK) ×3 IMPLANT
TRAY FOLEY W/METER SILVER 16FR (SET/KITS/TRAYS/PACK) ×1 IMPLANT
YANKAUER SUCT BULB TIP 10FT TU (MISCELLANEOUS) ×3 IMPLANT

## 2014-12-27 NOTE — Progress Notes (Signed)
X-ray results noted 

## 2014-12-27 NOTE — Transfer of Care (Addendum)
Immediate Anesthesia Transfer of Care Note  Patient: [name]  Procedure(s) Performed: Procedure(s): TOTAL LEFT HIP ARTHROPLASTY ANTERIOR APPROACH (Left)  Patient Location: PACU  Anesthesia Type:Spinal  Level of Consciousness:  sedated, patient cooperative and responds to stimulation  Airway & Oxygen Therapy:Patient Spontanous Breathing and Patient connected to face mask oxgen  Post-op Assessment:  Report given to PACU RN and Post -op Vital signs reviewed and stable  Post vital signs:  Reviewed and stable, spinal level T11  Last Vitals:  Filed Vitals:   12/27/14 1333  BP: 163/90  Pulse: 72  Temp: 36.5 C  Resp: 16    Complications: No apparent anesthesia complications

## 2014-12-27 NOTE — Progress Notes (Signed)
Patient states she has taken all of the cipro Dr Lequita HaltAluisio called in.

## 2014-12-27 NOTE — Progress Notes (Signed)
Portable AP Pelvis X-ray done. 

## 2014-12-27 NOTE — Anesthesia Procedure Notes (Addendum)
Spinal Patient location during procedure: OR Start time: 12/27/2014 3:17 PM End time: 12/27/2014 3:23 PM Staffing Anesthesiologist: Rod Mae Resident/CRNA: Darlys Gales R Performed by: resident/CRNA  Preanesthetic Checklist Completed: patient identified, site marked, surgical consent, pre-op evaluation, timeout performed, IV checked, risks and benefits discussed and monitors and equipment checked Spinal Block Patient position: sitting Prep: Betadine Patient monitoring: heart rate, continuous pulse ox and blood pressure Approach: midline Location: L3-4 Injection technique: single-shot Needle Needle type: Spinocan  Needle gauge: 22 G Needle length: 9 cm Needle insertion depth: 7 cm Assessment Sensory level: T6 Additional Notes Expiration date of kit checked and confirmed. Patient tolerated procedure well, without complications. Lot [id number] exp 2016-04-09

## 2014-12-27 NOTE — Anesthesia Preprocedure Evaluation (Signed)
Anesthesia Evaluation  Patient identified by MRN, date of birth, ID band Patient awake    Reviewed: Allergy & Precautions, H&P , NPO status , Patient's Chart, lab work & pertinent test results  Airway Mallampati: II  TM Distance: >3 FB Neck ROM: full    Dental no notable dental hx. (+) Dental Advisory Given, Teeth Intact   Pulmonary neg pulmonary ROS, former smoker,    Pulmonary exam normal breath sounds clear to auscultation       Cardiovascular Exercise Tolerance: Good negative cardio ROS Normal cardiovascular exam Rhythm:regular Rate:Normal     Neuro/Psych negative neurological ROS  negative psych ROS   GI/Hepatic negative GI ROS, Neg liver ROS,   Endo/Other  negative endocrine ROS  Renal/GU negative Renal ROS  negative genitourinary   Musculoskeletal   Abdominal   Peds  Hematology negative hematology ROS (+)   Anesthesia Other Findings   Reproductive/Obstetrics negative OB ROS                             Anesthesia Physical Anesthesia Plan  ASA: II  Anesthesia Plan: Spinal   Post-op Pain Management:    Induction:   Airway Management Planned:   Additional Equipment:   Intra-op Plan:   Post-operative Plan:   Informed Consent: I have reviewed the patients History and Physical, chart, labs and discussed the procedure including the risks, benefits and alternatives for the proposed anesthesia with the patient or authorized representative who has indicated his/her understanding and acceptance.   Dental Advisory Given  Plan Discussed with: CRNA and Surgeon  Anesthesia Plan Comments:         Anesthesia Quick Evaluation

## 2014-12-27 NOTE — Op Note (Signed)
OPERATIVE REPORT  PREOPERATIVE DIAGNOSIS: Osteoarthritis of the Left hip.   POSTOPERATIVE DIAGNOSIS: Osteoarthritis of the Left  hip.   PROCEDURE: Left total hip arthroplasty, anterior approach.   SURGEON: Ollen GrossFrank Lanissa Cashen, MD   ASSISTANT: Avel Peacerew Perkins, PA-C  ANESTHESIA:  Spinal  ESTIMATED BLOOD LOSS:-100 ml    DRAINS: Hemovac x1.   COMPLICATIONS: None   CONDITION: PACU - hemodynamically stable.   BRIEF CLINICAL NOTE: [name] is a 69 y.o. female who has advanced end-  stage arthritis of her Left  hip with progressively worsening pain and  dysfunction.The patient has failed nonoperative management and presents for  total hip arthroplasty.   PROCEDURE IN DETAIL: After successful administration of spinal  anesthetic, the traction boots for the [facility] bed were placed on both  feet and the patient was [facility] onto the [facility] bed, boots placed into the leg  holders. The Left hip was then isolated from the perineum with plastic  drapes and prepped and draped in the usual sterile fashion. ASIS and  greater trochanter were marked and a oblique incision was made, starting  at about 1 cm lateral and 2 cm distal to the ASIS and coursing towards  the anterior cortex of the femur. The skin was cut with a 10 blade  through subcutaneous tissue to the level of the fascia overlying the  tensor fascia lata muscle. The fascia was then incised in line with the  incision at the junction of the anterior third and posterior 2/3rd. The  muscle was teased off the fascia and then the interval between the TFL  and the rectus was developed. The Hohmann retractor was then placed at  the top of the femoral neck over the capsule. The vessels overlying the  capsule were cauterized and the fat on top of the capsule was removed.  A Hohmann retractor was then placed anterior underneath the rectus  femoris to give exposure to the entire anterior capsule. A T-shaped  capsulotomy was performed. The  edges were tagged and the femoral head  was identified.       Osteophytes are removed off the superior acetabulum.  The femoral neck was then cut in situ with an oscillating saw. Traction  was then applied to the left lower extremity [facility] the [facility]  traction. The femoral head was then removed. Retractors were placed  around the acetabulum and then circumferential removal of the labrum was  performed. Osteophytes were also removed. Reaming starts at 45 mm to  medialize and  Increased in 2 mm increments to 49 mm. We reamed in  approximately 40 degrees of abduction, 20 degrees anteversion. A 50 mm  pinnacle acetabular shell was then impacted in anatomic position under  fluoroscopic guidance with excellent purchase. We did not need to place  any additional dome screws. A 32 mm neutral + 4 marathon liner was then  placed into the acetabular shell.       The femoral lift was then placed along the lateral aspect of the femur  just distal to the vastus ridge. The leg was  externally rotated and capsule  was stripped off the inferior aspect of the femoral neck down to the  level of the lesser trochanter, this was done with electrocautery. The femur was lifted after this was performed. The  leg was then placed and extended in adducted position to essentially delivering the femur. We also removed the capsule superiorly and the  piriformis from the piriformis  fossa to gain excellent exposure of the  proximal femur. Rongeur was used to remove some cancellous bone to get  into the lateral portion of the proximal femur for placement of the  initial starter reamer. The starter broaches was placed  the starter broach  and was shown to go down the center of the canal. Broaching  with the  Corail system was then performed starting at size 8, coursing  Up to size 11. A size 11 had excellent torsional and rotational  and axial stability. The trial standard offset neck was then placed  with a 32 + 1 trial  head. The hip was then reduced. We confirmed that  the stem was in the canal both on AP and lateral x-rays. It also has excellent sizing. The hip was reduced with outstanding stability through full extension, full external rotation,  and then flexion in adduction internal rotation. AP pelvis was taken  and the leg lengths were measured and found to be exactly equal. Hip  was then dislocated again and the femoral head and neck removed. The  femoral broach was removed. Size 11 Corail stem with a standard offset  neck was then impacted into the femur following native anteversion. Has  excellent purchase in the canal. Excellent torsional and rotational and  axial stability. It is confirmed to be in the canal on AP and lateral  fluoroscopic views. The 32 + 1 ceramic head was placed and the hip  reduced with outstanding stability. Again AP pelvis was taken and it  confirmed that the leg lengths were equal. The wound was then copiously  irrigated with saline solution and the capsule reattached and repaired  with Ethibond suture. 30 ml of .25% Bupivicaine injected into the capsule and into the edge of the tensor fascia lata as well as subcutaneous tissue. The fascia overlying the tensor fascia lata was  then closed with a running #1 V-Loc. Subcu was closed with interrupted  2-0 Vicryl and subcuticular running 4-0 Monocryl. Incision was cleaned  and dried. Steri-Strips and a bulky sterile dressing applied. Hemovac  drain was hooked to suction and then he was awakened and transported to  recovery in stable condition.        Please note that a surgical assistant was a medical necessity for this procedure to perform it in a safe and expeditious manner. Assistant was necessary to provide appropriate retraction of vital neurovascular structures and to prevent femoral fracture and allow for anatomic placement of the prosthesis.  Gaynelle Arabian, M.D.

## 2014-12-27 NOTE — Interval H&P Note (Signed)
History and Physical Interval Note:  12/27/2014 3:07 PM  [name]  has presented today for surgery, with the diagnosis of OA LEFT HIP   The various methods of treatment have been discussed with the patient and family. After consideration of risks, benefits and other options for treatment, the patient has consented to  Procedure(s): TOTAL LEFT HIP ARTHROPLASTY ANTERIOR APPROACH (Left) as a surgical intervention .  The patient's history has been reviewed, patient examined, no change in status, stable for surgery.  I have reviewed the patient's chart and labs.  Questions were answered to the patient's satisfaction.     Loanne DrillingALUISIO,Wanetta Funderburke V

## 2014-12-28 ENCOUNTER — Encounter (HOSPITAL_COMMUNITY): Payer: Self-pay | Admitting: Orthopedic Surgery

## 2014-12-28 LAB — CBC
HCT: 38.3 % (ref 36.0–46.0)
Hemoglobin: 12.8 g/dL (ref 12.0–15.0)
MCH: 32.1 pg (ref 26.0–34.0)
MCHC: 33.4 g/dL (ref 30.0–36.0)
MCV: 96 fL (ref 78.0–100.0)
PLATELETS: 317 10*3/uL (ref 150–400)
RBC: 3.99 MIL/uL (ref 3.87–5.11)
RDW: 13.3 % (ref 11.5–15.5)
WBC: 10 10*3/uL (ref 4.0–10.5)

## 2014-12-28 LAB — BASIC METABOLIC PANEL
Anion gap: 8 (ref 5–15)
BUN: 10 mg/dL (ref 6–20)
CO2: 24 mmol/L (ref 22–32)
CREATININE: 0.56 mg/dL (ref 0.44–1.00)
Calcium: 9.1 mg/dL (ref 8.9–10.3)
Chloride: 102 mmol/L (ref 101–111)
GFR calc Af Amer: >60 mL/min (ref >60)
GFR calc non Af Amer: >60 mL/min (ref >60)
GLUCOSE: 157 mg/dL — AB (ref 65–99)
POTASSIUM: 4.3 mmol/L (ref 3.5–5.1)
Sodium: 134 mmol/L — ABNORMAL LOW (ref 135–145)

## 2014-12-28 MED ORDER — TRAMADOL HCL 50 MG PO TABS: 50.0000 mg | ORAL_TABLET | Freq: Four times a day (QID) | ORAL | Status: DC | PRN

## 2014-12-28 MED ORDER — RIVAROXABAN 10 MG PO TABS: 10.0000 mg | ORAL_TABLET | Freq: Every day | ORAL | Status: DC

## 2014-12-28 MED ORDER — OXYCODONE HCL 5 MG PO TABS
5.0000 mg | ORAL_TABLET | ORAL | Status: DC | PRN
Start: 2014-12-28 — End: 2021-10-22

## 2014-12-28 MED ORDER — METHOCARBAMOL 500 MG PO TABS: 500.0000 mg | ORAL_TABLET | Freq: Four times a day (QID) | ORAL | Status: DC | PRN

## 2014-12-28 NOTE — Progress Notes (Signed)
   Subjective: 1 Day Post-Op Procedure(s) (LRB): TOTAL LEFT HIP ARTHROPLASTY ANTERIOR APPROACH (Left) Patient reports pain as mild.   Patient seen in rounds by Dr. Lequita HaltAluisio.  Doing well on morning rounds. Patient is well, and has had no acute complaints or problems We will start therapy today.  If they do well with therapy and meets all goals, then will allow home later this afternoon following therapy. Plan is to go Home after hospital stay.  Objective: Vital signs in last 24 hours: Temp:  [97.4 F (36.3 C)-98 F (36.7 C)] 98 F (36.7 C) (11/17 0300) Pulse Rate:  [64-79] 70 (11/17 0300) Resp:  [11-18] 14 (11/17 0300) BP: (111-163)/(63-90) 122/63 mmHg (11/17 0300) SpO2:  [94 %-100 %] 98 % (11/17 0300) Weight:  [72.576 kg (160 lb)] 72.576 kg (160 lb) (11/16 1830)  Intake/Output from previous day:  Intake/Output Summary (Last 24 hours) at 12/28/14 0757 Last data filed at 12/28/14 0419  Gross per 24 hour  Intake   3260 ml  Output   2400 ml  Net    860 ml    Intake/Output this shift: UOP 800 since around [place]  Labs:  Recent Labs  12/28/14 0410  HGB 12.8    Recent Labs  12/28/14 0410  WBC 10.0  RBC 3.99  HCT 38.3  PLT 317    Recent Labs  12/28/14 0410  NA 134*  K 4.3  CL 102  CO2 24  BUN 10  CREATININE 0.56  GLUCOSE 157*  CALCIUM 9.1   No results for input(s): LABPT, INR in the last 72 hours.  EXAM General - Patient is Alert, Appropriate and Oriented Extremity - Neurovascular intact Sensation intact distally Dorsiflexion/Plantar flexion intact Dressing - dressing C/D/I Motor Function - intact, moving foot and toes well on exam.  Hemovac pulled without difficulty.  History reviewed. No pertinent past medical history.  Assessment/Plan: 1 Day Post-Op Procedure(s) (LRB): TOTAL LEFT HIP ARTHROPLASTY ANTERIOR APPROACH (Left) Principal Problem:   OA (osteoarthritis) of hip  Estimated body mass index is 28.35 kg/(m^2) as calculated from the  following:   Height as of this encounter: 5\' 3"  (1.6 m).   Weight as of this encounter: 72.576 kg (160 lb). Advance diet Up with therapy Discharge home with home health  DVT Prophylaxis - Xarelto Weight Bearing As Tolerated left Leg Hemovac Pulled Begin Therapy  If meets goals and able to go home: Up with therapy Discharge home with home health Diet - Cardiac diet Follow up - in 2 weeks on Thursday 01/11/2015 Activity - WBAT Disposition - Home Condition Upon Discharge - pending D/C Meds - See DC Summary DVT Prophylaxis - Xarelto  Avel Peacerew Cenia Zaragosa, PA-C Orthopaedic Surgery 12/28/2014, 7:57 AM

## 2014-12-28 NOTE — Discharge Instructions (Addendum)
° °Dr. Frank Aluisio °Total Joint Specialist °[facility] °[address] °[place], [place] [address] °[phone] ° °ANTERIOR APPROACH TOTAL HIP REPLACEMENT POSTOPERATIVE DIRECTIONS ° ° °Hip Rehabilitation, Guidelines Following Surgery  °The results of a hip operation are greatly improved after range of motion and muscle strengthening exercises. Follow all safety measures which are given to protect your hip. If any of these exercises cause increased pain or swelling in your joint, decrease the amount until you are comfortable again. Then slowly increase the exercises. Call your caregiver if you have problems or questions.  ° °HOME CARE INSTRUCTIONS  °Remove items at home which could result in a fall. This includes throw rugs or furniture in walking pathways.  °· ICE to the affected hip every three hours for 30 minutes at a time and then as needed for pain and swelling.  Continue to use ice on the hip for pain and swelling from surgery. You may notice swelling that will progress down to the foot and ankle.  This is normal after surgery.  Elevate the leg when you are not up walking on it.   °· Continue to use the breathing machine which will help keep your temperature down.  It is common for your temperature to cycle up and down following surgery, especially at night when you are not up moving around and exerting yourself.  The breathing machine keeps your lungs expanded and your temperature down. ° ° °DIET °You may resume your previous home diet once your are discharged from the hospital. ° °DRESSING / WOUND CARE / SHOWERING °You may shower 3 days after surgery, but keep the wounds dry during showering.  You may use an occlusive plastic wrap (Press'n Seal for example), NO SOAKING/SUBMERGING IN THE BATHTUB.  If the bandage gets wet, change with a clean dry gauze.  If the incision gets wet, pat the wound dry with a clean towel. °You may start showering once you are discharged home but do not  submerge the incision under water. Just pat the incision dry and apply a dry gauze dressing on daily. °Change the surgical dressing daily and reapply a dry dressing each time. ° °ACTIVITY °Walk with your walker as instructed. °Use walker as long as suggested by your caregivers. °Avoid periods of inactivity such as sitting longer than an hour when not asleep. This helps prevent blood clots.  °You may resume a sexual relationship in one month or when given the OK by your doctor.  °You may return to work once you are cleared by your doctor.  °Do not drive a car for 6 weeks or until released by you surgeon.  °Do not drive while taking narcotics. ° °WEIGHT BEARING °Weight bearing as tolerated with assist device (walker, cane, etc) as directed, use it as long as suggested by your surgeon or therapist, typically at least 4-6 weeks. ° °POSTOPERATIVE CONSTIPATION PROTOCOL °Constipation - defined medically as fewer than three stools per week and severe constipation as less than one stool per week. ° °One of the most common issues patients have following surgery is constipation.  Even if you have a regular bowel pattern at home, your normal regimen is likely to be disrupted due to multiple reasons following surgery.  Combination of anesthesia, postoperative narcotics, change in appetite and fluid intake all can affect your bowels.  In order to avoid complications following surgery, here are some recommendations in order to help you during your recovery period. ° °Colace (docusate) - Pick up an over-the-counter   form of Colace or another stool softener and take twice a day as long as you are requiring postoperative pain medications.  Take with a full glass of water daily.  If you experience loose stools or diarrhea, hold the colace until you stool forms back up.  If your symptoms do not get better within 1 week or if they get worse, check with your doctor. ° °Dulcolax (bisacodyl) - Pick up over-the-counter and take as directed  by the product packaging as needed to assist with the movement of your bowels.  Take with a full glass of water.  Use this product as needed if not relieved by Colace only.  ° °MiraLax (polyethylene glycol) - Pick up over-the-counter to have on hand.  MiraLax is a solution that will increase the amount of water in your bowels to assist with bowel movements.  Take as directed and can mix with a glass of water, juice, soda, coffee, or tea.  Take if you go more than two days without a movement. °Do not use MiraLax more than once per day. Call your doctor if you are still constipated or irregular after using this medication for 7 days in a row. ° °If you continue to have problems with postoperative constipation, please contact the office for further assistance and recommendations.  If you experience "the worst abdominal pain ever" or develop nausea or vomiting, please contact the office immediatly for further recommendations for treatment. ° °ITCHING ° If you experience itching with your medications, try taking only a single pain pill, or even half a pain pill at a time.  You can also use Benadryl over the counter for itching or also to help with sleep.  ° °TED HOSE STOCKINGS °Wear the elastic stockings on both legs for three weeks following surgery during the day but you may remove then at night for sleeping. ° °MEDICATIONS °See your medication summary on the “After Visit Summary” that the nursing staff will review with you prior to discharge.  You may have some home medications which will be placed on hold until you complete the course of blood thinner medication.  It is important for you to complete the blood thinner medication as prescribed by your surgeon.  Continue your approved medications as instructed at time of discharge. ° °PRECAUTIONS °If you experience chest pain or shortness of breath - call 911 immediately for transfer to the hospital emergency department.  °If you develop a fever greater that 101 F,  purulent drainage from wound, increased redness or drainage from wound, foul odor from the wound/dressing, or calf pain - CONTACT YOUR SURGEON.   °                                                °FOLLOW-UP APPOINTMENTS °Make sure you keep all of your appointments after your operation with your surgeon and caregivers. You should call the office at the above phone number and make an appointment for approximately two weeks after the date of your surgery or on the date instructed by your surgeon outlined in the "After Visit Summary". ° °RANGE OF MOTION AND STRENGTHENING EXERCISES  °These exercises are designed to help you keep full movement of your hip joint. Follow your caregiver's or physical therapist's instructions. Perform all exercises about fifteen times, three times per day or as directed. Exercise both hips, even if you   have had only one joint replacement. These exercises can be done on a training (exercise) mat, on the floor, on a table or on a bed. Use whatever works the best and is most comfortable for you. Use music or television while you are exercising so that the exercises are a pleasant break in your day. This will make your life better with the exercises acting as a break in routine you can look forward to.  °Lying on your back, slowly slide your foot toward your buttocks, raising your knee up off the floor. Then slowly slide your foot back down until your leg is straight again.  °Lying on your back spread your legs as far apart as you can without causing discomfort.  °Lying on your side, raise your upper leg and foot straight up from the floor as far as is comfortable. Slowly lower the leg and repeat.  °Lying on your back, tighten up the muscle in the front of your thigh (quadriceps muscles). You can do this by keeping your leg straight and trying to raise your heel off the floor. This helps strengthen the largest muscle supporting your knee.  °Lying on your back, tighten up the muscles of your  buttocks both with the legs straight and with the knee bent at a comfortable angle while keeping your heel on the floor.  ° °IF YOU ARE TRANSFERRED TO A SKILLED REHAB FACILITY °If the patient is transferred to a skilled rehab facility following release from the hospital, a list of the current medications will be sent to the facility for the patient to continue.  When discharged from the skilled rehab facility, please have the facility set up the patient's Home Health Physical Therapy prior to being released. Also, the skilled facility will be responsible for providing the patient with their medications at time of release from the facility to include their pain medication, the muscle relaxants, and their blood thinner medication. If the patient is still at the rehab facility at time of the two week follow up appointment, the skilled rehab facility will also need to assist the patient in arranging follow up appointment in our office and any transportation needs. ° °MAKE SURE YOU:  °Understand these instructions.  °Get help right away if you are not doing well or get worse.  ° °Pick up stool softner and laxative for home use following surgery while on pain medications. °Do not submerge incision under water. °May remove the surgical dressing tomorrow, Friday 12/29/2014, and then apply a dry gauze dressing daily. °Please use good hand washing techniques while changing dressing each day. °May shower starting three days after surgery starting Saturday 12/30/2014. °Please use a clean towel to pat the incision dry following showers. °Continue to use ice for pain and swelling after surgery. °Do not use any lotions or creams on the incision until instructed by your surgeon. ° °Take Xarelto for two and a half more weeks, then discontinue Xarelto. °Once the patient has completed the Xarelto, they may resume the 81 mg Aspirin. ° ° °Information on my medicine - XARELTO® (Rivaroxaban) ° °This medication education was reviewed with  me or my healthcare representative as part of my discharge preparation.  The pharmacist that spoke with me during my hospital stay was:  Georgia Delsignore M, RPH ° °Why was Xarelto® prescribed for you? °Xarelto® was prescribed for you to reduce the risk of blood clots forming after orthopedic surgery. The medical term for these abnormal blood clots is venous thromboembolism (VTE). ° °What   do you need to know about xarelto® ? °Take your Xarelto® ONCE DAILY at the same time every day. °You may take it either with or without food. ° °If you have difficulty swallowing the tablet whole, you may crush it and mix in applesauce just prior to taking your dose. ° °Take Xarelto® exactly as prescribed by your doctor and DO NOT stop taking Xarelto® without talking to the doctor who prescribed the medication.  Stopping without other VTE prevention medication to take the place of Xarelto® may increase your risk of developing a clot. ° °After discharge, you should have regular check-up appointments with your healthcare provider that is prescribing your Xarelto®.   ° °What do you do if you miss a dose? °If you miss a dose, take it as soon as you remember on the same day then continue your regularly scheduled once daily regimen the next day. Do not take two doses of Xarelto® on the same day.  ° °Important Safety Information °A possible side effect of Xarelto® is bleeding. You should call your healthcare provider right away if you experience any of the following: °? Bleeding from an injury or your nose that does not stop. °? Unusual colored urine (red or dark brown) or unusual colored stools (red or black). °? Unusual bruising for unknown reasons. °? A serious fall or if you hit your head (even if there is no bleeding). ° °Some medicines may interact with Xarelto® and might increase your risk of bleeding while on Xarelto®. To help avoid this, consult your healthcare provider or pharmacist prior to using any new prescription or  non-prescription medications, including herbals, vitamins, non-steroidal anti-inflammatory drugs (NSAIDs) and supplements. ° °This website has more information on Xarelto®: www.xarelto.com. ° ° ° °

## 2014-12-28 NOTE — Discharge Summary (Signed)
Physician Discharge Summary   Patient ID: [name] MRN: [id number] DOB/AGE: 1945-06-08 69 y.o.  Admit date: 12/27/2014 Discharge date: 12-28-2014  Primary Diagnosis:  Osteoarthritis of the Left hip.   Admission Diagnoses:  History reviewed. No pertinent past medical history. Discharge Diagnoses:   Principal Problem:   OA (osteoarthritis) of hip  Estimated body mass index is 28.35 kg/(m^2) as calculated from the following:   Height as of this encounter: $RemoveBeforeD'5\' 3"'OvjxHwvguCTxWD$  (1.6 m).   Weight as of this encounter: 72.576 kg (160 lb).  Procedure(s) (LRB): TOTAL LEFT HIP ARTHROPLASTY ANTERIOR APPROACH (Left)   Consults: None  HPI: [name] is a 69 y.o. female who has advanced end-  stage arthritis of her Left hip with progressively worsening pain and  dysfunction.The patient has failed nonoperative management and presents for  total hip arthroplasty.   Laboratory Data: Admission on 12/27/2014  Component Date Value Ref Range Status  . WBC 12/28/2014 10.0  4.0 - 10.5 K/uL Final  . RBC 12/28/2014 3.99  3.87 - 5.11 MIL/uL Final  . Hemoglobin 12/28/2014 12.8  12.0 - 15.0 g/dL Final  . HCT 12/28/2014 38.3  36.0 - 46.0 % Final  . MCV 12/28/2014 96.0  78.0 - 100.0 fL Final  . MCH 12/28/2014 32.1  26.0 - 34.0 pg Final  . MCHC 12/28/2014 33.4  30.0 - 36.0 g/dL Final  . RDW 12/28/2014 13.3  11.5 - 15.5 % Final  . Platelets 12/28/2014 317  150 - 400 K/uL Final  . Sodium 12/28/2014 134* 135 - 145 mmol/L Final  . Potassium 12/28/2014 4.3  3.5 - 5.1 mmol/L Final  . Chloride 12/28/2014 102  101 - 111 mmol/L Final  . CO2 12/28/2014 24  22 - 32 mmol/L Final  . Glucose, Bld 12/28/2014 157* 65 - 99 mg/dL Final  . BUN 12/28/2014 10  6 - 20 mg/dL Final  . Creatinine, Ser 12/28/2014 0.56  0.44 - 1.00 mg/dL Final  . Calcium 12/28/2014 9.1  8.9 - 10.3 mg/dL Final  . GFR calc non Af Amer 12/28/2014 >60  >60 mL/min Final  . GFR calc Af Amer 12/28/2014 >60  >60 mL/min Final   Comment:  (NOTE) The eGFR has been calculated using the CKD EPI equation. This calculation has not been validated in all clinical situations. eGFR's persistently <60 mL/min signify possible Chronic Kidney Disease.   Georgiann Hahn gap 12/28/2014 8  5 - 15 Final  Hospital Outpatient Visit on 12/18/2014  Component Date Value Ref Range Status  . aPTT 12/18/2014 28  24 - 37 seconds Final  . WBC 12/18/2014 5.1  4.0 - 10.5 K/uL Final  . RBC 12/18/2014 4.42  3.87 - 5.11 MIL/uL Final  . Hemoglobin 12/18/2014 14.2  12.0 - 15.0 g/dL Final  . HCT 12/18/2014 42.7  36.0 - 46.0 % Final  . MCV 12/18/2014 96.6  78.0 - 100.0 fL Final  . MCH 12/18/2014 32.1  26.0 - 34.0 pg Final  . MCHC 12/18/2014 33.3  30.0 - 36.0 g/dL Final  . RDW 12/18/2014 13.4  11.5 - 15.5 % Final  . Platelets 12/18/2014 347  150 - 400 K/uL Final  . Sodium 12/18/2014 137  135 - 145 mmol/L Final  . Potassium 12/18/2014 4.6  3.5 - 5.1 mmol/L Final  . Chloride 12/18/2014 103  101 - 111 mmol/L Final  . CO2 12/18/2014 27  22 - 32 mmol/L Final  . Glucose, Bld 12/18/2014 88  65 - 99 mg/dL Final  . BUN 12/18/2014 12  6 - 20 mg/dL Final  . Creatinine, Ser 12/18/2014 0.63  0.44 - 1.00 mg/dL Final  . Calcium 12/18/2014 9.9  8.9 - 10.3 mg/dL Final  . Total Protein 12/18/2014 7.4  6.5 - 8.1 g/dL Final  . Albumin 12/18/2014 4.5  3.5 - 5.0 g/dL Final  . AST 12/18/2014 31  15 - 41 U/L Final  . ALT 12/18/2014 31  14 - 54 U/L Final  . Alkaline Phosphatase 12/18/2014 61  38 - 126 U/L Final  . Total Bilirubin 12/18/2014 0.7  0.3 - 1.2 mg/dL Final  . GFR calc non Af Amer 12/18/2014 >60  >60 mL/min Final  . GFR calc Af Amer 12/18/2014 >60  >60 mL/min Final   Comment: (NOTE) The eGFR has been calculated using the CKD EPI equation. This calculation has not been validated in all clinical situations. eGFR's persistently <60 mL/min signify possible Chronic Kidney Disease.   . Anion gap 12/18/2014 7  5 - 15 Final  . Prothrombin Time 12/18/2014 13.2  11.6 - 15.2  seconds Final  . INR 12/18/2014 0.98  0.00 - 1.49 Final  . ABO/RH(D) 12/18/2014 B POS   Final  . Antibody Screen 12/18/2014 NEG   Final  . Sample Expiration 12/18/2014 12/30/2014   Final  . Extend sample reason 12/18/2014 NO TRANSFUSIONS OR PREGNANCY IN THE PAST 3 MONTHS   Final  . Color, Urine 12/18/2014 YELLOW  YELLOW Final  . APPearance 12/18/2014 CLEAR  CLEAR Final  . Specific Gravity, Urine 12/18/2014 1.009  1.005 - 1.030 Final  . pH 12/18/2014 7.5  5.0 - 8.0 Final  . Glucose, UA 12/18/2014 NEGATIVE  NEGATIVE mg/dL Final  . Hgb urine dipstick 12/18/2014 NEGATIVE  NEGATIVE Final  . Bilirubin Urine 12/18/2014 NEGATIVE  NEGATIVE Final  . Ketones, ur 12/18/2014 NEGATIVE  NEGATIVE mg/dL Final  . Protein, ur 12/18/2014 NEGATIVE  NEGATIVE mg/dL Final  . Urobilinogen, UA 12/18/2014 0.2  0.0 - 1.0 mg/dL Final  . Nitrite 12/18/2014 NEGATIVE  NEGATIVE Final  . Leukocytes, UA 12/18/2014 MODERATE* NEGATIVE Final  . MRSA, PCR 12/18/2014 NEGATIVE  NEGATIVE Final  . Staphylococcus aureus 12/18/2014 NEGATIVE  NEGATIVE Final   Comment:        The Xpert SA Assay (FDA approved for NASAL specimens in patients over 67 years of age), is one component of a comprehensive surveillance program.  Test performance has been validated by [facility] for patients greater than or equal to 75 year old. It is not intended to diagnose infection nor to guide or monitor treatment.   . Squamous Epithelial / LPF 12/18/2014 RARE  RARE Final  . WBC, UA 12/18/2014 11-20  <3 WBC/hpf Final  . RBC / HPF 12/18/2014 0-2  <3 RBC/hpf Final     X-Rays:Dg Pelvis Portable  12/27/2014  CLINICAL DATA:  Subsequent encounter for left hip replacement EXAM: PORTABLE PELVIS 1-2 VIEWS; DG C-ARM 1-60 MIN-NO REPORT COMPARISON:  09/23/2010. FINDINGS: 1713 hours. Patient has undergone interval left total hip replacement. No evidence for immediate hardware complications. Surgical drain overlies the soft tissues of the proximal thigh.  Gas in the visualized soft tissues is compatible with the immediate postoperative state. Right total hip replacement again noted. IMPRESSION: Status post left total hip replacement without evidence for immediate hardware complications. Electronically Signed   By: Misty Stanley M.D.   On: 12/27/2014 17:24   Dg C-arm 1-60 Min-no Report  12/27/2014  CLINICAL DATA: hip C-ARM 1-60 MINUTES Fluoroscopy was utilized by the requesting physician.  No radiographic  interpretation.    EKG:No orders found for this or any previous visit.   Hospital Course: Patient was admitted to [facility] and taken to the OR and underwent the above state procedure without complications.  Patient tolerated the procedure well and was later transferred to the recovery room and then to the orthopaedic floor for postoperative care.  They were given PO and IV analgesics for pain control following their surgery.  They were given 24 hours of postoperative antibiotics of  Anti-infectives    Start     Dose/Rate Route Frequency Ordered Stop   12/28/14 0600  ceFAZolin (ANCEF) IVPB 2 g/50 mL premix     2 g 100 mL/hr over 30 Minutes Intravenous On call to O.R. 12/27/14 1439 12/27/14 1525   12/27/14 2200  ceFAZolin (ANCEF) IVPB 2 g/50 mL premix     2 g 100 mL/hr over 30 Minutes Intravenous Every 6 hours 12/27/14 1836 12/28/14 0430     and started on DVT prophylaxis in the form of Xarelto.   PT and OT were ordered for total hip protocol.  The patient was allowed to be WBAT with therapy. Discharge planning was consulted to help with postop disposition and equipment needs.  Patient had a good night on the evening of surgery.  They started to get up OOB with therapy on day one.  Hemovac drain was pulled without difficulty.  Dressing was checked and clean and dry.  Patient was seen in rounds by Dr. Wynelle Link and was doing well and was setup to go home on day one as long as they met their therapy goals with therapy.  Discharge home with  home health Diet - Cardiac diet Follow up - in 2 weeks on Thursday 01/11/2015 Activity - WBAT Disposition - Home Condition Upon Discharge - improved D/C Meds - See DC Summary DVT Prophylaxis - Xarelto   Discharge Instructions    Call MD / Call 911    Complete by:  As directed   If you experience chest pain or shortness of breath, CALL 911 and be transported to the hospital emergency room.  If you develope a fever above 101 F, pus (white drainage) or increased drainage or redness at the wound, or calf pain, call your surgeon's office.     Change dressing    Complete by:  As directed   You may change your dressing dressing daily with sterile 4 x 4 inch gauze dressing and paper tape.  Do not submerge the incision under water.     Constipation Prevention    Complete by:  As directed   Drink plenty of fluids.  Prune juice may be helpful.  You may use a stool softener, such as Colace (over the counter) 100 mg twice a day.  Use MiraLax (over the counter) for constipation as needed.     Diet - low sodium heart healthy    Complete by:  As directed      Discharge instructions    Complete by:  As directed   Pick up stool softner and laxative for home use following surgery while on pain medications. Do not submerge incision under water. May remove the surgical dressing tomorrow, Friday 12/29/2014, and then apply a dry gauze dressing daily. Please use good hand washing techniques while changing dressing each day. May shower starting three days after surgery starting Saturday 12/30/2014. Please use a clean towel to pat the incision dry following showers. Continue to use ice for pain and swelling after surgery. Do  not use any lotions or creams on the incision until instructed by your surgeon.  Postoperative Constipation Protocol  Constipation - defined medically as fewer than three stools per week and severe constipation as less than one stool per week.  One of the most common issues patients  have following surgery is constipation. Even if you have a regular bowel pattern at home, your normal regimen is likely to be disrupted due to multiple reasons following surgery. Combination of anesthesia, postoperative narcotics, change in appetite and fluid intake all can affect your bowels. In order to avoid complications following surgery, here are some recommendations in order to help you during your recovery period.  Colace (docusate) - Pick up an over-the-counter form of Colace or another stool softener and take twice a day as long as you are requiring postoperative pain medications. Take with a full glass of water daily. If you experience loose stools or diarrhea, hold the colace until you stool forms back up. If your symptoms do not get better within 1 week or if they get worse, check with your doctor.  Dulcolax (bisacodyl) - Pick up over-the-counter and take as directed by the product packaging as needed to assist with the movement of your bowels. Take with a full glass of water. Use this product as needed if not relieved by Colace only.   MiraLax (polyethylene glycol) - Pick up over-the-counter to have on hand. MiraLax is a solution that will increase the amount of water in your bowels to assist with bowel movements. Take as directed and can mix with a glass of water, juice, soda, coffee, or tea. Take if you go more than two days without a movement. Do not use MiraLax more than once per day. Call your doctor if you are still constipated or irregular after using this medication for 7 days in a row.  If you continue to have problems with postoperative constipation, please contact the office for further assistance and recommendations. If you experience "the worst abdominal pain ever" or develop nausea or vomiting, please contact the office immediatly for further recommendations for treatment.  Take Xarelto for two and a half more weeks, then discontinue Xarelto. Once the patient has  completed the Xarelto, they may resume the 81 mg Aspirin.     Do not sit on low chairs, stoools or toilet seats, as it may be difficult to get up from low surfaces    Complete by:  As directed      Driving restrictions    Complete by:  As directed   No driving until released by the physician.     Increase activity slowly as tolerated    Complete by:  As directed      Lifting restrictions    Complete by:  As directed   No lifting until released by the physician.     Patient may shower    Complete by:  As directed   You may shower without a dressing once there is no drainage.  Do not wash over the wound.  If drainage remains, do not shower until drainage stops.     TED hose    Complete by:  As directed   Use stockings (TED hose) for 3 weeks on both leg(s).  You may remove them at night for sleeping.     Weight bearing as tolerated    Complete by:  As directed   Laterality:  left  Extremity:  Lower  Medication List    STOP taking these medications        aspirin EC 81 MG tablet     calcium carbonate 1250 MG capsule     HAIR SKIN & NAILS GUMMIES PO      TAKE these medications        acetaminophen 500 MG tablet  Commonly known as:  TYLENOL  Take 1,000 mg by mouth 2 (two) times daily as needed for mild pain.     amoxicillin 500 MG capsule  Commonly known as:  AMOXIL  Take 2,000 mg by mouth once as needed. Prior to dental visits     EPINEPHrine 0.3 mg/0.3 mL Devi  Commonly known as:  EPIPEN  Inject 0.3 mLs (0.3 mg total) into the muscle as needed.     methocarbamol 500 MG tablet  Commonly known as:  ROBAXIN  Take 1 tablet (500 mg total) by mouth every 6 (six) hours as needed for muscle spasms.     oxyCODONE 5 MG immediate release tablet  Commonly known as:  Oxy IR/ROXICODONE  Take 1-2 tablets (5-10 mg total) by mouth every 3 (three) hours as needed for moderate pain or severe pain.     pravastatin 20 MG tablet  Commonly known as:  PRAVACHOL  Take 20 mg  by mouth every evening.     rivaroxaban 10 MG Tabs tablet  Commonly known as:  XARELTO  Take 1 tablet (10 mg total) by mouth daily with breakfast. Take Xarelto for two and a half more weeks, then discontinue Xarelto. Once the patient has completed the Xarelto, they may resume the 81 mg Aspirin.     traMADol 50 MG tablet  Commonly known as:  ULTRAM  Take 1-2 tablets (50-100 mg total) by mouth every 6 (six) hours as needed (mild pain).           Follow-up Information    Follow up with Gearlean Alf, MD. Schedule an appointment as soon as possible for a visit on 01/11/2015.   Specialty:  Orthopedic Surgery   Why:  Call office at [phone] to setup appointment on Thursday 01/11/2015 with Dr. Wynelle Link.   Contact information:   [address] [place] [address] [phone]       Signed: Arlee Muslim, PA-C Orthopaedic Surgery 12/28/2014, 8:15 AM

## 2014-12-28 NOTE — Progress Notes (Signed)
Discharged from floor via w/c, belongings & family with pt. No changes in assessment. [name]  

## 2014-12-28 NOTE — Progress Notes (Signed)
Utilization review completed.  

## 2014-12-28 NOTE — Progress Notes (Signed)
Occupational Therapy Evaluation Patient Details Name: [name] MRN: [id number] DOB: 03/29/1945 Today's Date: 12/28/2014    History of Present Illness s/p TOTAL LEFT HIP ARTHROPLASTY ANTERIOR APPROACH (Left)   Clinical Impression   Patient presents to OT at supervision level with basic ADLs. All education complete and patient plans to d/c home today with family assistance. No further OT needs. Will sign off.    Follow Up Recommendations  No OT follow up;Supervision - Intermittent    Equipment Recommendations  3 in 1 bedside comode    Recommendations for Other Services       Precautions / Restrictions Precautions Precautions: None Restrictions Weight Bearing Restrictions: Yes LLE Weight Bearing: Weight bearing as tolerated      Mobility Bed Mobility                  Transfers Overall transfer level: Needs assistance Equipment used: Rolling walker (2 wheeled) Transfers: Sit to/from Stand Sit to Stand: Supervision              Balance                                            ADL Overall ADL's : Needs assistance/impaired Eating/Feeding: Independent   Grooming: Wash/dry hands;Supervision/safety;Standing   Upper Body Bathing: Set up;Sitting   Lower Body Bathing: Supervison/ safety;Sit to/from stand   Upper Body Dressing : Set up;Sitting   Lower Body Dressing: Supervision/safety;Sit to/from stand   Toilet Transfer: Supervision/safety;Ambulation;BSC;RW   Toileting- Clothing Manipulation and Hygiene: Supervision/safety;Sit to/from stand   Tub/ Shower Transfer: Walk-in shower;Supervision/safety;Rolling walker   Functional mobility during ADLs: Supervision/safety;Rolling walker General ADL Comments: Patient educated on LB self-care techniques. Able to don pants and underwear supervision. Patient stood from recliner to RW with supervision and ambulated to and from bathroom. Toilet transfer and toileting supervision.  Practiced shower transfer with supervision after educated on technique. Patient tolerated session well and will d/c home today. Her daughter and son-in-law will assist her as needed x 1 week.      Vision     Perception     Praxis      Pertinent Vitals/Pain Pain Assessment: No/denies pain     Hand Dominance Right   Extremity/Trunk Assessment Upper Extremity Assessment Upper Extremity Assessment: Overall WFL for tasks assessed   Lower Extremity Assessment Lower Extremity Assessment: Defer to PT evaluation       Communication Communication Communication: No difficulties   Cognition Arousal/Alertness: Awake/alert Behavior During Therapy: WFL for tasks assessed/performed Overall Cognitive Status: Within Functional Limits for tasks assessed                     General Comments       Exercises       Shoulder Instructions      Home Living Family/patient expects to be discharged to:: Private residence Living Arrangements: Alone Available Help at Discharge:  (daughter and son-in-law to stay with patient x 1 week) Type of Home: House Home Access: Stairs to enter Entergy CorporationEntrance Stairs-Number of Steps: 1   Home Layout: One level     Bathroom Shower/Tub: Producer, television/film/videoWalk-in shower   Bathroom Toilet: Standard Bathroom Accessibility: Yes How Accessible: Accessible via walker Home Equipment: None          Prior Functioning/Environment Level of Independence: Independent             OT  Diagnosis: Acute pain   OT Problem List: Decreased activity tolerance;Decreased knowledge of use of DME or AE;Pain   OT Treatment/Interventions:      OT Goals(Current goals can be found in the care plan section) Acute Rehab OT Goals Patient Stated Goal: to go home today OT Goal Formulation: All assessment and education complete, DC therapy  OT Frequency:     Barriers to D/C:            Co-evaluation              End of Session Equipment Utilized During Treatment: Rolling  walker Nurse Communication: Other (comment) (leaking from dressing)  Activity Tolerance: Patient tolerated treatment well Patient left: in chair;with call bell/phone within reach   Time: 1610-9604 OT Time Calculation (min): 24 min Charges:  OT General Charges $OT Visit: 1 Procedure OT Evaluation $Initial OT Evaluation Tier I: 1 Procedure OT Treatments $Self Care/Home Management : 8-22 mins G-Codes:    Haroun Cotham A 01-25-2015, 11:41 AM

## 2014-12-28 NOTE — Anesthesia Postprocedure Evaluation (Signed)
  Anesthesia Post-op Note  Patient: [name]  Procedure(s) Performed: Procedure(s) (LRB): TOTAL LEFT HIP ARTHROPLASTY ANTERIOR APPROACH (Left)  Patient Location: PACU  Anesthesia Type: Spinal  Level of Consciousness: awake and alert   Airway and Oxygen Therapy: Patient Spontanous Breathing  Post-op Pain: mild  Post-op Assessment: Post-op Vital signs reviewed, Patient's Cardiovascular Status Stable, Respiratory Function Stable, Patent Airway and No signs of Nausea or vomiting  Last Vitals:  Filed Vitals:   12/28/14 0300  BP: 122/63  Pulse: 70  Temp: 36.7 C  Resp: 14    Post-op Vital Signs: stable   Complications: No apparent anesthesia complications

## 2014-12-28 NOTE — Evaluation (Signed)
Physical Therapy Evaluation Patient Details Name: [name] MRN: [id number] DOB: 12/09/1945 Today's Date: 12/28/2014   History of Present Illness  s/p TOTAL LEFT HIP ARTHROPLASTY ANTERIOR APPROACH (Left)  Clinical Impression  Pt s/p L THR presents with decreased L LE strength./ROM and post op pain limiting functional mobility.  Pt should progress well to dc home with family assist and HHPT follow up.    Follow Up Recommendations Home health PT    Equipment Recommendations  Rolling walker with 5" wheels    Recommendations for Other Services OT consult     Precautions / Restrictions Precautions Precautions: None Restrictions Weight Bearing Restrictions: No LLE Weight Bearing: Weight bearing as tolerated      Mobility  Bed Mobility Overal bed mobility: Needs Assistance Bed Mobility: Supine to Sit     Supine to sit: Supervision     General bed mobility comments: min cues for use of R LE to self assist  Transfers Overall transfer level: Needs assistance Equipment used: Rolling walker (2 wheeled) Transfers: Sit to/from Stand Sit to Stand: Supervision         General transfer comment: cues for LE managment and use of UEs to self assist  Ambulation/Gait Ambulation/Gait assistance: Min guard;Supervision Ambulation Distance (Feet): 180 Feet Assistive device: Rolling walker (2 wheeled) Gait Pattern/deviations: Step-to pattern;Step-through pattern;Decreased step length - right;Decreased step length - left;Shuffle;Trunk flexed     General Gait Details: cues for posture, position from RW and initial sequence  Stairs Stairs: Yes Stairs assistance: Min assist Stair Management: No rails;Step to pattern;Backwards;With walker Number of Stairs: 4 General stair comments: Min cues for sequence and foot placement  Wheelchair Mobility    Modified Rankin (Stroke Patients Only)       Balance                                              Pertinent Vitals/Pain Pain Assessment: 0-10 Pain Score: 2  Pain Location: L hip Pain Descriptors / Indicators: Aching;Sore Pain Intervention(s): Limited activity within patient's tolerance;Monitored during session;Premedicated before session;Ice applied    Home Living Family/patient expects to be discharged to:: Private residence Living Arrangements: Alone Available Help at Discharge: Family;Available 24 hours/day Type of Home: House Home Access: Stairs to enter Entrance Stairs-Rails: None Entrance Stairs-Number of Steps: 2 Home Layout: One level Home Equipment: None      Prior Function Level of Independence: Independent               Hand Dominance   Dominant Hand: Right    Extremity/Trunk Assessment   Upper Extremity Assessment: Overall WFL for tasks assessed           Lower Extremity Assessment: LLE deficits/detail   LLE Deficits / Details: Strength at hip 3/5 with AAROM at hip to 95 flex and 20 abd  Cervical / Trunk Assessment: Normal  Communication   Communication: No difficulties  Cognition Arousal/Alertness: Awake/alert Behavior During Therapy: WFL for tasks assessed/performed Overall Cognitive Status: Within Functional Limits for tasks assessed                      General Comments      Exercises Total Joint Exercises Ankle Circles/Pumps: AROM;Both;15 reps;Supine Quad Sets: AROM;Both;Supine;10 reps Heel Slides: AAROM;20 reps;Supine;Left Hip ABduction/ADduction: AAROM;Left;15 reps;Supine      Assessment/Plan    PT Assessment Patient needs continued PT services  PT Diagnosis Difficulty walking   PT Problem List Decreased strength;Decreased range of motion;Decreased activity tolerance;Decreased mobility;Decreased knowledge of use of DME;Pain  PT Treatment Interventions DME instruction;Gait training;Stair training;Functional mobility training;Therapeutic activities;Therapeutic exercise;Patient/family education   PT Goals  (Current goals can be found in the Care Plan section) Acute Rehab PT Goals Patient Stated Goal: to go home today PT Goal Formulation: With patient Time For Goal Achievement: 12/29/14 Potential to Achieve Goals: Good    Frequency 7X/week   Barriers to discharge        Co-evaluation               End of Session Equipment Utilized During Treatment: Gait belt Activity Tolerance: Patient tolerated treatment well Patient left: in chair;with call bell/phone within reach;with family/visitor present Nurse Communication: Mobility status         Time: 1610-9604 PT Time Calculation (min) (ACUTE ONLY): 38 min   Charges:   PT Evaluation $Initial PT Evaluation Tier I: 1 Procedure PT Treatments $Gait Training: 8-22 mins $Therapeutic Exercise: 8-22 mins   PT G Codes:        Anorah Trias 01/20/15, 12:56 PM

## 2014-12-28 NOTE — Care Management Note (Signed)
Case Management Note  Patient Details  Name: Timesha Cervantez MRN: [id number] Date of Birth: 01/06/1946  Subjective/Objective:                  TOTAL LEFT HIP ARTHROPLASTY ANTERIOR APPROACH (Left) Action/Plan: Discharge planning Expected Discharge Date:  12/28/14               Expected Discharge Plan:  [place]  In-House Referral:     Discharge planning Services  CM Consult  Post Acute Care Choice:  Home Health Choice offered to:  Patient  DME Arranged:  3-N-1, Walker rolling DME Agency:  [place]:  [place] Agency:  [place]  Status of Service:  Completed, signed off  Medicare Important Message Given:    Date Medicare IM Given:    Medicare IM give by:    Date Additional Medicare IM Given:    Additional Medicare Important Message give by:     If discussed at [place] of Stay Meetings, dates discussed:    Additional Comments: CM met with pt in room to offer choice of home health agency.  Pt chooses [facility] to render HHPT.  Referral given to [facility] rep, Tim (on unit).  CM called [facility] DME rep, Lecretia to please deliver the 3n1 and rolling walker to room prior to discharge.  No other CM needs were communicated. Dellie Catholic, RN 12/28/2014, 10:53 AM

## 2015-04-17 ENCOUNTER — Encounter: Payer: Self-pay | Admitting: Internal Medicine

## 2015-05-22 ENCOUNTER — Encounter: Payer: Self-pay | Admitting: Internal Medicine

## 2015-05-29 ENCOUNTER — Ambulatory Visit (AMBULATORY_SURGERY_CENTER): Payer: Self-pay

## 2015-05-29 VITALS — Ht 63.0 in | Wt 162.2 lb

## 2015-05-29 DIAGNOSIS — Z1211 Encounter for screening for malignant neoplasm of colon: Secondary | ICD-10-CM

## 2015-05-29 MED ORDER — NA SULFATE-K SULFATE-MG SULF 17.5-3.13-1.6 GM/177ML PO SOLN: ORAL | Status: DC

## 2015-05-29 NOTE — Progress Notes (Signed)
Per pt, no allergies to soy or egg products.Pt not taking any weight loss meds or using  O2 at home. 

## 2015-06-11 ENCOUNTER — Telehealth: Payer: Self-pay | Admitting: Internal Medicine

## 2015-06-11 NOTE — Telephone Encounter (Signed)
If she feels like she can go through the examination, that would be okay. If she feels like she prefers scheduling for another time, that would be okay also

## 2015-06-11 NOTE — Telephone Encounter (Signed)
Pt chose to cancel procedure; rescheduled for May 24 at 2:00 p.m.  New instructions mailed to pt and told to called back if she has any further questions.  She still has her Suprep from procedure for tomorrow.

## 2015-06-11 NOTE — Telephone Encounter (Signed)
Returned patient's call and she states that her temperature was 100.2 and that she hasn't felt right today.  She is wanting to know if she can have the procedure tomorrow.  She states that she really wants to have it but doesn't know if she is developing anything or not.  I advised her that I would check with Dr. Marina GoodellPerry and give her a call back.  She agreed to this.  Please advise Dr. Marina GoodellPerry.

## 2015-06-11 NOTE — Telephone Encounter (Signed)
Called patient and advised her that it would be up to her if she wants to have the procedure or not tomorrow.  I left message on voicemail for her to call us @ [phone] and let us know for sure if she will be coming tomorrow or not.

## 2015-06-12 ENCOUNTER — Encounter: Payer: Medicare Other | Admitting: Internal Medicine

## 2015-07-04 ENCOUNTER — Encounter: Payer: Self-pay | Admitting: Internal Medicine

## 2015-07-04 ENCOUNTER — Ambulatory Visit (AMBULATORY_SURGERY_CENTER): Payer: Medicare Other | Admitting: Internal Medicine

## 2015-07-04 VITALS — BP 110/51 | HR 74 | Temp 99.6°F | Resp 15 | Ht 63.0 in | Wt 162.0 lb

## 2015-07-04 DIAGNOSIS — Z1211 Encounter for screening for malignant neoplasm of colon: Secondary | ICD-10-CM

## 2015-07-04 MED ORDER — SODIUM CHLORIDE 0.9 % IV SOLN: 500.0000 mL | INTRAVENOUS | Status: DC

## 2015-07-04 NOTE — Progress Notes (Signed)
TO PACU VSS PATENT AW REPORT TO RN

## 2015-07-04 NOTE — Op Note (Signed)
[facility] Patient Name: [name] Procedure Date: 07/04/2015 1:57 PM MRN: [id number] Endoscopist: Wilhemina Bonito. Marina Goodell , MD Age: 70 Referring MD:  Date of Birth: 1945-11-24 Gender: Female Procedure:                Colonoscopy Indications:              Screening for colorectal malignant neoplasm. Prior                            examination October 2006 was normal Medicines:                Monitored Anesthesia Care Procedure:                Pre-Anesthesia Assessment:                           - Prior to the procedure, a History and Physical                            was performed, and patient medications and                            allergies were reviewed. The patient's tolerance of                            previous anesthesia was also reviewed. The risks                            and benefits of the procedure and the sedation                            options and risks were discussed with the patient.                            All questions were answered, and informed consent                            was obtained. Prior Anticoagulants: The patient has                            taken no previous anticoagulant or antiplatelet                            agents. ASA Grade Assessment: II - A patient with                            mild systemic disease. After reviewing the risks                            and benefits, the patient was deemed in                            satisfactory condition to undergo the procedure.  After obtaining informed consent, the colonoscope                            was passed under direct vision. Throughout the                            procedure, the patient's blood pressure, pulse, and                            oxygen saturations were monitored continuously. The                            Model CF-HQ190L [id number]) scope was introduced                            through the anus and advanced to the the  cecum,                            identified by appendiceal orifice and ileocecal                            valve. The ileocecal valve, appendiceal orifice,                            and rectum were photographed. The quality of the                            bowel preparation was excellent. The colonoscopy                            was performed without difficulty. The patient                            tolerated the procedure well. The bowel preparation                            used was SUPREP. Scope In: 2:04:47 PM Scope Out: 2:18:02 PM Scope Withdrawal Time: 0 hours 9 minutes 58 seconds  Total Procedure Duration: 0 hours 13 minutes 15 seconds  Findings:                 Multiple small-mouthed diverticula were found in                            the sigmoid colon.                           Internal hemorrhoids were found during                            retroflexion. The hemorrhoids were small.                           The exam was otherwise without abnormality on  direct and retroflexion views. Complications:            No immediate complications. Estimated blood loss:                            None. Estimated Blood Loss:     Estimated blood loss: none. Impression:               - Diverticulosis in the sigmoid colon.                           - Internal hemorrhoids.                           - The examination was otherwise normal on direct                            and retroflexion views.                           - No specimens collected. Recommendation:           - Repeat colonoscopy in 10 years for screening                            purposes.                           - Patient has a contact number available for                            emergencies. The signs and symptoms of potential                            delayed complications were discussed with the                            patient. Return to normal activities tomorrow.                             Written discharge instructions were provided to the                            patient.                           - Resume previous diet.                           - Continue present medications. Wilhemina BonitoJohn N. Marina GoodellPerry, MD 07/04/2015 2:23:00 PM This report has been signed electronically.

## 2015-07-04 NOTE — Patient Instructions (Signed)
YOU HAD AN ENDOSCOPIC PROCEDURE TODAY AT THE [facility]:   Refer to the procedure report that was given to you for any specific questions about what was found during the examination.  If the procedure report does not answer your questions, please call your gastroenterologist to clarify.  If you requested that your care partner not be given the details of your procedure findings, then the procedure report has been included in a sealed envelope for you to review at your convenience later.  YOU SHOULD EXPECT: Some feelings of bloating in the abdomen. Passage of more gas than usual.  Walking can help get rid of the air that was put into your GI tract during the procedure and reduce the bloating. If you had a lower endoscopy (such as a colonoscopy or flexible sigmoidoscopy) you may notice spotting of blood in your stool or on the toilet paper. If you underwent a bowel prep for your procedure, you may not have a normal bowel movement for a few days.  Please Note:  You might notice some irritation and congestion in your nose or some drainage.  This is from the oxygen used during your procedure.  There is no need for concern and it should clear up in a day or so.  SYMPTOMS TO REPORT IMMEDIATELY:   Following lower endoscopy (colonoscopy or flexible sigmoidoscopy):  Excessive amounts of blood in the stool  Significant tenderness or worsening of abdominal pains  Swelling of the abdomen that is new, acute  Fever of 100F or higher   For urgent or emergent issues, a gastroenterologist can be reached at any hour by calling [phone].   DIET: Your first meal following the procedure should be a small meal and then it is ok to progress to your normal diet. Heavy or fried foods are harder to digest and may make you feel nauseous or bloated.  Likewise, meals heavy in dairy and vegetables can increase bloating.  Drink plenty of fluids but you should avoid alcoholic beverages for 24  hours.  ACTIVITY:  You should plan to take it easy for the rest of today and you should NOT DRIVE or use heavy machinery until tomorrow (because of the sedation medicines used during the test).    FOLLOW UP: Our staff will call the number listed on your records the next business day following your procedure to check on you and address any questions or concerns that you may have regarding the information given to you following your procedure. If we do not reach you, we will leave a message.  However, if you are feeling well and you are not experiencing any problems, there is no need to return our call.  We will assume that you have returned to your regular daily activities without incident.  If any biopsies were taken you will be contacted by phone or by letter within the next 1-3 weeks.  Please call us at [phone] if you have not heard about the biopsies in 3 weeks.    SIGNATURES/CONFIDENTIALITY: You and/or your care partner have signed paperwork which will be entered into your electronic medical record.  These signatures attest to the fact that that the information above on your After Visit Summary has been reviewed and is understood.  Full responsibility of the confidentiality of this discharge information lies with you and/or your care-partner. 

## 2015-07-05 ENCOUNTER — Telehealth: Payer: Self-pay | Admitting: *Deleted

## 2015-07-05 NOTE — Telephone Encounter (Signed)
  Follow up Call-  Call back number 07/04/2015  Post procedure Call Back phone  # [phone]  Permission to leave phone message Yes     Patient questions:  Do you have a fever, pain , or abdominal swelling? No. Pain Score  0 *  Have you tolerated food without any problems? Yes.    Have you been able to return to your normal activities? Yes.    Do you have any questions about your discharge instructions: Diet   No. Medications  No. Follow up visit  No.  Do you have questions or concerns about your Care? No.  Actions: * If pain score is 4 or above: No action needed, pain <4.

## 2016-01-31 ENCOUNTER — Other Ambulatory Visit: Payer: Self-pay | Admitting: Family Medicine

## 2016-01-31 DIAGNOSIS — M48062 Spinal stenosis, lumbar region with neurogenic claudication: Secondary | ICD-10-CM

## 2016-02-01 ENCOUNTER — Ambulatory Visit
Admission: RE | Admit: 2016-02-01 | Discharge: 2016-02-01 | Disposition: A | Discharge status: Home / Self Care | Payer: Medicare Other | Source: Ambulatory Visit | Attending: Family Medicine | Admitting: Family Medicine

## 2016-02-01 DIAGNOSIS — M48062 Spinal stenosis, lumbar region with neurogenic claudication: Secondary | ICD-10-CM

## 2016-02-12 ENCOUNTER — Emergency Department (HOSPITAL_COMMUNITY)
Admission: EM | Admit: 2016-02-12 | Discharge: 2016-02-12 | Disposition: A | Discharge status: Home / Self Care | Payer: Medicare Other | Attending: Emergency Medicine | Admitting: Emergency Medicine

## 2016-02-12 ENCOUNTER — Encounter (HOSPITAL_COMMUNITY): Payer: Self-pay | Admitting: Emergency Medicine

## 2016-02-12 DIAGNOSIS — Z87891 Personal history of nicotine dependence: Secondary | ICD-10-CM | POA: Diagnosis not present

## 2016-02-12 DIAGNOSIS — S0101XA Laceration without foreign body of scalp, initial encounter: Secondary | ICD-10-CM | POA: Insufficient documentation

## 2016-02-12 DIAGNOSIS — Y929 Unspecified place or not applicable: Secondary | ICD-10-CM | POA: Diagnosis not present

## 2016-02-12 DIAGNOSIS — W208XXA Other cause of strike by thrown, projected or falling object, initial encounter: Secondary | ICD-10-CM | POA: Diagnosis not present

## 2016-02-12 DIAGNOSIS — Z96643 Presence of artificial hip joint, bilateral: Secondary | ICD-10-CM | POA: Insufficient documentation

## 2016-02-12 DIAGNOSIS — Y939 Activity, unspecified: Secondary | ICD-10-CM | POA: Diagnosis not present

## 2016-02-12 DIAGNOSIS — Y999 Unspecified external cause status: Secondary | ICD-10-CM | POA: Insufficient documentation

## 2016-02-12 NOTE — ED Notes (Signed)
Claire Maneampos, MD aware of pt BP upon discharge, MD talked w/ pt and states to continue discharge.

## 2016-02-12 NOTE — ED Provider Notes (Signed)
[facility]-EMERGENCY DEPT Provider Note   CSN: [id number] Arrival date & time: 02/12/16  1542     History   Chief Complaint Chief Complaint  Patient presents with  . Head Laceration    HPI [name] is a 71 y.o. female.  HPI Patient presents emergency department with a laceration to her scalp after a toaster oven fell on top.  She denies headache but presents with a laceration to her scalp.  She is not on anticoagulants.  She denies neck pain.  No weakness of her arms or legs.  No chest pain shortness of breath.  Denies abdominal pain.  No other complaints at this time.   Past Medical History:  Diagnosis Date  . Hyperlipidemia     Patient Active Problem List   Diagnosis Date Noted  . OA (osteoarthritis) of hip 12/27/2014    Past Surgical History:  Procedure Laterality Date  . JOINT REPLACEMENT     right hip   . right carpal tunnel release     . 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 Authorizing Provider  acetaminophen (TYLENOL) 500 MG tablet Take 1,000 mg by mouth 2 (two) times daily as needed for mild pain. Reported on 05/29/2015    Historical Provider, MD  amoxicillin (AMOXIL) 500 MG capsule Take 2,000 mg by mouth once as needed. Reported on 05/29/2015 09/11/14   Historical Provider, MD  Biotin w/ Vitamins C & E (HAIR SKIN & NAILS GUMMIES PO) Take by mouth daily.    Historical Provider, MD  Calcium Citrate-Vitamin D (CALCIUM + D PO) Take 500 Units by mouth. Take 2 tablets daily    Historical Provider, MD  EPINEPHrine (EPIPEN) 0.3 mg/0.3 mL DEVI Inject 0.3 mLs (0.3 mg total) into the muscle as needed. Patient taking differently: Inject 0.3  mg into the muscle as needed (for anaphylaxctic reaction to wasps).  10/21/11   Cheri Guppy, MD  methocarbamol (ROBAXIN) 500 MG tablet Take 1 tablet (500 mg total) by mouth every 6 (six) hours as needed for muscle spasms. Patient not taking: Reported on 05/29/2015 12/28/14   Alexzandrew L Perkins, PA-C  oxyCODONE (OXY IR/ROXICODONE) 5 MG immediate release tablet Take 1-2 tablets (5-10 mg total) by mouth every 3 (three) hours as needed for moderate pain or severe pain. Patient not taking: Reported on 05/29/2015 12/28/14   Alexzandrew L Perkins, PA-C  pravastatin (PRAVACHOL) 20 MG tablet Take 20 mg by mouth every evening.    Historical Provider, MD  traMADol (ULTRAM) 50 MG tablet Take 1-2 tablets (50-100 mg total) by mouth every 6 (six) hours as needed (mild pain). Patient not taking: Reported on 05/29/2015 12/28/14   Alexzandrew Tessie Fass, PA-C    Family History No family history on file.  Social History Social History  Substance Use Topics  . Smoking status: Former Games developer  . Smokeless tobacco: Never Used     Comment: Pt stopped smoking in her 20's  . Alcohol use 6.0 - 8.4 oz/week    10 - 14 Glasses of wine per week     Comment: occasional      Allergies  Bee venom   Review of Systems Review of Systems  All other systems reviewed and are negative.    Physical Exam Updated Vital Signs BP (!) 236/110 (BP Location: Left Arm)   Pulse 83   Temp 98.2 F (36.8 C) (Oral)   Resp 18   SpO2 97%   Physical Exam  Constitutional: She is oriented to person, place, and time. She appears well-developed and well-nourished.  HENT:  Scalp laceration to the top of her head.  This is 2 cm in length.  No active bleeding.  No significant hematoma  Eyes: EOM are normal.  Neck: Normal range of motion.  Cardiovascular: Normal rate.   Pulmonary/Chest: Effort normal.  Abdominal: She exhibits no distension.  Musculoskeletal: Normal range of motion.  Neurological: She is alert and oriented  to person, place, and time.  Psychiatric: She has a normal mood and affect.  Nursing note and vitals reviewed.    ED Treatments / Results  Labs (all labs ordered are listed, but only abnormal results are displayed) Labs Reviewed - No data to display  EKG  EKG Interpretation None       Radiology No results found.  Procedures Procedures (including critical care time)  ++++++++++++++++++++++++++++++++++++++++++++++++  LACERATION REPAIR Performed by: Lyanne CoAMPOS,Mikhaila Roh M Consent: Verbal consent obtained. Risks and benefits: risks, benefits and alternatives were discussed Patient identity confirmed: provided demographic data Time out performed prior to procedure Prepped and Draped in normal sterile fashion Wound explored Laceration Location: scalp Laceration Length: 2cm No Foreign Bodies seen or palpated Anesthesia: local infiltration Local anesthetic:none Amount of cleaning: standard Skin closure: staple Number of sutures or staples: 3 Technique: staple Patient tolerance: Patient tolerated the procedure well with no immediate complications.   ++++++++++++++++++++++++++++++++++++++++++++++++++++++++++   Medications Ordered in ED Medications - No data to display   Initial Impression / Assessment and Plan / ED Course  I have reviewed the triage vital signs and the nursing notes.  Pertinent labs & imaging results that were available during my care of the patient were reviewed by me and considered in my medical decision making (see chart for details).  Clinical Course     Laceration repair.  No indication for CT imaging of the head.  Head injury warnings given.  Infection warnings given.  Final Clinical Impressions(s) / ED Diagnoses   Final diagnoses:  Scalp laceration, initial encounter    New Prescriptions New Prescriptions   No medications on file     Azalia BilisKevin Kullen Tomasetti, MD 02/13/16 [phone]

## 2016-02-12 NOTE — ED Triage Notes (Signed)
Per pt, states she was putting away [employer] decorations when a toaster oven fell on top of her head-EMS called out and bandage head-bleeding controlled-here for sutures

## 2016-05-27 ENCOUNTER — Encounter (HOSPITAL_COMMUNITY): Payer: Self-pay | Admitting: Emergency Medicine

## 2016-05-27 ENCOUNTER — Emergency Department (HOSPITAL_COMMUNITY)
Admission: EM | Admit: 2016-05-27 | Discharge: 2016-05-27 | Disposition: A | Discharge status: Home / Self Care | Payer: Medicare Other | Attending: Emergency Medicine | Admitting: Emergency Medicine

## 2016-05-27 DIAGNOSIS — Z87891 Personal history of nicotine dependence: Secondary | ICD-10-CM | POA: Diagnosis not present

## 2016-05-27 DIAGNOSIS — Z96643 Presence of artificial hip joint, bilateral: Secondary | ICD-10-CM | POA: Diagnosis not present

## 2016-05-27 DIAGNOSIS — Z23 Encounter for immunization: Secondary | ICD-10-CM | POA: Diagnosis not present

## 2016-05-27 DIAGNOSIS — T63441A Toxic effect of venom of bees, accidental (unintentional), initial encounter: Secondary | ICD-10-CM | POA: Insufficient documentation

## 2016-05-27 DIAGNOSIS — R220 Localized swelling, mass and lump, head: Secondary | ICD-10-CM | POA: Diagnosis present

## 2016-05-27 MED ORDER — DIPHENHYDRAMINE HCL 25 MG PO CAPS
50.0000 mg | ORAL_CAPSULE | Freq: Once | ORAL | Status: AC
  Administered 2016-05-27: 50 mg via ORAL
  Filled 2016-05-27: qty 2

## 2016-05-27 MED ORDER — PREDNISONE 20 MG PO TABS: 40.0000 mg | ORAL_TABLET | Freq: Every day | ORAL | 0 refills | Status: DC

## 2016-05-27 MED ORDER — FAMOTIDINE 20 MG PO TABS
40.0000 mg | ORAL_TABLET | Freq: Once | ORAL | Status: AC
  Administered 2016-05-27: 40 mg via ORAL
  Filled 2016-05-27: qty 2

## 2016-05-27 MED ORDER — DIPHENHYDRAMINE HCL 25 MG PO TABS: 25.0000 mg | ORAL_TABLET | Freq: Four times a day (QID) | ORAL | 0 refills | Status: DC | PRN

## 2016-05-27 MED ORDER — PREDNISONE 20 MG PO TABS
60.0000 mg | ORAL_TABLET | Freq: Once | ORAL | Status: AC
  Administered 2016-05-27: 60 mg via ORAL
  Filled 2016-05-27: qty 3

## 2016-05-27 MED ORDER — TETANUS-DIPHTH-ACELL PERTUSSIS 5-2.5-18.5 LF-MCG/0.5 IM SUSP
0.5000 mL | Freq: Once | INTRAMUSCULAR | Status: AC
  Administered 2016-05-27: 0.5 mL via INTRAMUSCULAR
  Filled 2016-05-27: qty 0.5

## 2016-05-27 MED ORDER — EPINEPHRINE 0.3 MG/0.3ML IJ SOAJ: 0.3000 mg | Freq: Once | INTRAMUSCULAR | 1 refills | Status: AC | PRN

## 2016-05-27 NOTE — ED Provider Notes (Signed)
Started by wasp on right hand developed hives on right arm and on her face after the sting. Treated herself with an EpiPen she is now asymptomatic except for mild swelling of her right hand after. Of observation in the emergency department feels ready to go home   Doug Sou, MD 05/27/16 2311

## 2016-05-27 NOTE — Discharge Instructions (Signed)
Take prednisone as prescribed until finished. You may take Benadryl as needed. Follow up with your primary care doctor.

## 2016-05-27 NOTE — ED Provider Notes (Signed)
[facility]-EMER[facility]ENCY DEPT Provider Note   CSN: [id number] Arrival date & time: 05/27/16  1925    History   Chief Complaint Chief Complaint  Patient presents with  . Wasp Sting    HPI [name] is a 71 y.o. female.  71 year old female with a history of dyslipidemia presents to the emergency department after she was stung by a wasp on her right hand. This occurred a proximally 1 hour prior to arrival. She reports sensation of bilateral eye swelling and redness. She also experienced some generalized itching and hives. She noted swelling and erythema to wasp sting location. She administered her EpiPen approximately 15 minutes after exposure. She states that she is feeling much better now. She denies any lip or tongue swelling, inability to swallow, drooling, shortness of breath, nausea, or vomiting. 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 Last similar reaction was in 2013.   The history is provided by the patient. No language interpreter was used.    Past Medical History:  Diagnosis Date  . Hyperlipidemia     Patient Active Problem List   Diagnosis Date Noted  . OA (osteoarthritis) of hip 12/27/2014    Past Surgical History:  Procedure Laterality Date  . JOINT REPLACEMENT     right hip   . right carpal tunnel release     . 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 Authorizing Provider  acetaminophen (TYLENOL) 500 MG tablet Take 1,000 mg by mouth 2 (two) times daily as needed for mild pain. Reported on 05/29/2015    Historical Provider, MD  amoxicillin (AMOXIL) 500 MG capsule Take 2,000 mg by mouth once as needed. Reported on  05/29/2015 09/11/14   Historical Provider, MD  Biotin w/ Vitamins C & E (HAIR SKIN & NAILS GUMMIES PO) Take by mouth daily.    Historical Provider, MD  Calcium Citrate-Vitamin D (CALCIUM + D PO) Take 500 Units by mouth. Take 2 tablets daily    Historical Provider, MD  diphenhydrAMINE (BENADRYL) 25 MG tablet Take 1 tablet (25 mg total) by mouth every 6 (six) hours as needed for itching (Rash). 05/27/16   Antony Madura, PA-C  EPINEPHrine (EPIPEN 2-PAK) 0.3 mg/0.3 mL IJ SOAJ injection Inject 0.3 mLs (0.3 mg total) into the muscle once as needed (for severe allergic reaction). CAll 911 immediately if you have to use this medicine 05/27/16   Antony Madura, PA-C  methocarbamol (ROBAXIN) 500 MG tablet Take 1 tablet (500 mg total) by mouth every 6 (six) hours as needed for muscle spasms. Patient not taking: Reported on 05/29/2015 12/28/14   Alexzandrew L Perkins, PA-C  oxyCODONE (OXY IR/ROXICODONE) 5 MG immediate release tablet Take 1-2 tablets (5-10 mg total) by mouth every 3 (three) hours as needed for moderate pain or severe pain. Patient not taking: Reported on 05/29/2015 12/28/14   Alexzandrew L Perkins, PA-C  pravastatin (PRAVACHOL) 20 MG tablet Take 20 mg by mouth every evening.    Historical Provider, MD  predniSONE (DELTASONE) 20 MG tablet Take 2 tablets (40 mg total) by mouth daily. Take 40 mg by mouth daily for 3  days, then  by mouth daily for 3 days, then  daily for 3 days 05/27/16   Antony Madura, PA-C  traMADol (ULTRAM) 50 MG tablet Take 1-2 tablets (50-100 mg total) by mouth every 6 (six) hours as needed (mild pain). Patient not taking: Reported on 05/29/2015 12/28/14   Alexzandrew Tessie Fass, PA-C    Family History No family history on file.  Social History Social History  Substance Use Topics  . Smoking status: Former Games developer  . Smokeless tobacco: Never Used     Comment: Pt stopped smoking in her 20's  . Alcohol use 6.0 - 8.4 oz/week    10 - 14 Glasses of wine per week     Comment:  occasional      Allergies   Bee venom   Review of Systems Review of Systems Ten systems reviewed and are negative for acute change, except as noted in the HPI.    Physical Exam Updated Vital Signs BP 139/89   Pulse 82   Temp 98.5 F (36.9 C) (Oral)   Resp 16   Ht  (1.626 m)   Wt 72.6 kg   SpO2 99%   BMI 27.46 kg/m   Physical Exam  Constitutional: She is oriented to person, place, and time. She appears well-developed and well-nourished. No distress.  Nontoxic appearing and in NAD  HENT:  Head: Normocephalic and atraumatic.  No angioedema. No tripoding or stridor. Patient tolerating secretions without difficulty.  Eyes: Conjunctivae and EOM are normal. No scleral icterus.  Neck: Normal range of motion.  Cardiovascular: Normal rate, regular rhythm and intact distal pulses.   Distal radial pulse 2+ in the RUE. Capillary refill brisk in all digits.  Pulmonary/Chest: Effort normal. No respiratory distress.  Respirations even and unlabored  Musculoskeletal: Normal range of motion. She exhibits edema.       Hands: Edema and erythema to the dorsal ulnar aspect of the R hand as well as the proximal 4th and 5th digits. No palpable or visible FB. No TTP. Normal ROM of right hand.  Neurological: She is alert and oriented to person, place, and time. She exhibits normal muscle tone. Coordination normal.  GCS 15. Patient moving all extremities.  Skin: Skin is warm and dry. Rash noted. She is not diaphoretic. There is erythema. No pallor.  Scattered urticaria to volar arms bilaterally.  Psychiatric: She has a normal mood and affect. Her behavior is normal.  Nursing note and vitals reviewed.    ED Treatments / Results  Labs (all labs ordered are listed, but only abnormal results are displayed) Labs Reviewed - No data to display  EKG  EKG Interpretation None       Radiology No results found.  Procedures Procedures (including critical care time)  Medications  Ordered in ED Medications  predniSONE (DELTASONE) tablet 60 mg (60 mg Oral Given 05/27/16 2033)  famotidine (PEPCID) tablet 40 mg (40 mg Oral Given 05/27/16 2033)  diphenhydrAMINE (BENADRYL) capsule 50 mg (50 mg Oral Given 05/27/16 2033)  Tdap (BOOSTRIX) injection 0.5 mL (0.5 mLs Intramuscular Given by Other 05/27/16 2147)     Initial Impression / Assessment and Plan / ED Course  I have reviewed the triage vital signs and the nursing notes.  Pertinent labs & imaging results that were available during my care of the patient were reviewed by me and considered in my medical decision making (see chart for details).     11:10 PM Patient reassessed after presenting for allergic reaction to wasp sting.  She self administered an EpiPen at home. She has been monitored until 4 hours post EpiPen injection. She has had no acute worsening of her symptoms or evidence of rebound reaction. Sting site on hand has also greatly improved. Erythema has lessened with supportive management. Plan to continue with outpatient prednisone taper and primary care follow-up. Patient discharged in stable condition with no unaddressed concerns.   Vitals:   05/27/16 2143 05/27/16 2144 05/27/16 2145 05/27/16 2146  BP:   139/89   Pulse: 78 78 79 82  Resp:      Temp:      TempSrc:      SpO2: 99% 98% 98% 99%  Weight:      Height:        Final Clinical Impressions(s) / ED Diagnoses   Final diagnoses:  Allergic reaction to bee sting    New Prescriptions New Prescriptions   DIPHENHYDRAMINE (BENADRYL) 25 MG TABLET    Take 1 tablet (25 mg total) by mouth every 6 (six) hours as needed for itching (Rash).   EPINEPHRINE (EPIPEN 2-PAK) 0.3 MG/0.3 ML IJ SOAJ INJECTION    Inject 0.3 mLs (0.3 mg total) into the muscle once as needed (for severe allergic reaction). CAll 911 immediately if you have to use this medicine   PREDNISONE (DELTASONE) 20 MG TABLET    Take 2 tablets (40 mg total) by mouth daily. Take 40 mg by mouth daily  for 3 days, then  by mouth daily for 3 days, then  daily for 3 days     Antony Madura, PA-C 05/27/16 2311    Doug Sou, MD 05/28/16 [phone]

## 2016-05-27 NOTE — ED Triage Notes (Addendum)
Patient stung by a wasp at right index finger this evening with itching and swelling , pt. added bilateral eyes redness and swelling . Pt. injected herself with Epinephrine pen . Airway intact / no oral swelling/stridor or dyspnea.

## 2017-07-02 IMAGING — MR MR LUMBAR SPINE W/O CM
4 of 5 series · 23 of 48 positions shown · non-contrast
Comparison: 04/19/2010

CLINICAL DATA: Pain and tightness in both legs with occasional
weakness and numbness. Symptoms for 3-4 months and worsening.

EXAM:
MRI LUMBAR SPINE WITHOUT CONTRAST
TECHNIQUE: Multiplanar, multisequence MR imaging of the lumbar spine was
performed. No intravenous contrast was administered.

[Series 3: T2 post-contrast · sagittal · 4.0mm · 0.55mm/px · 6 of 16 slices shown]
[im 1/16]
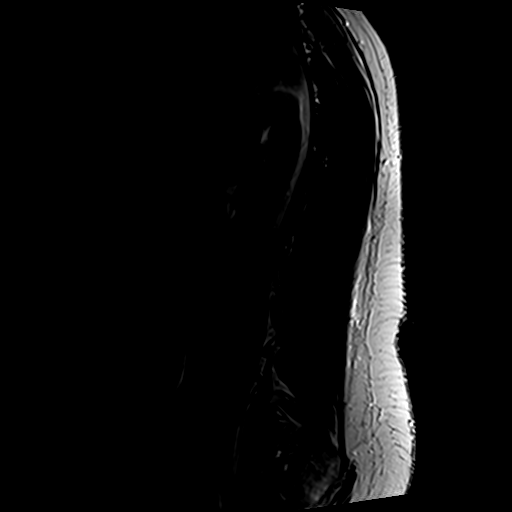
[im 4/16]
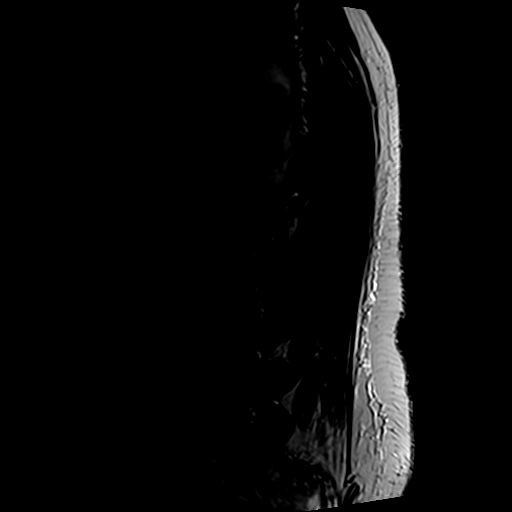
[im 7/16]
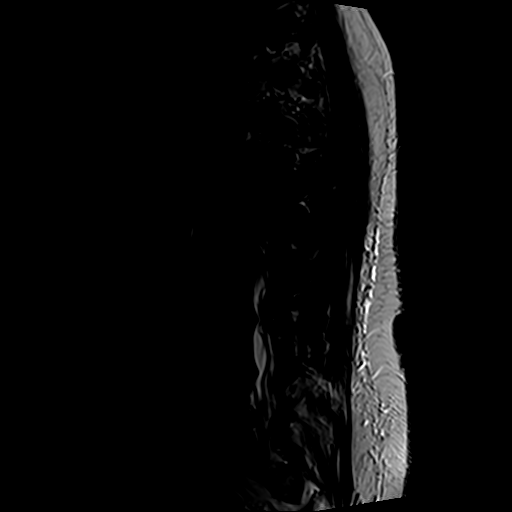
[im 10/16]
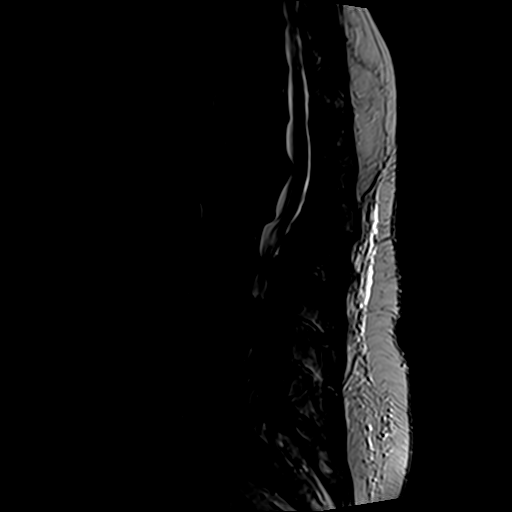
[im 13/16]
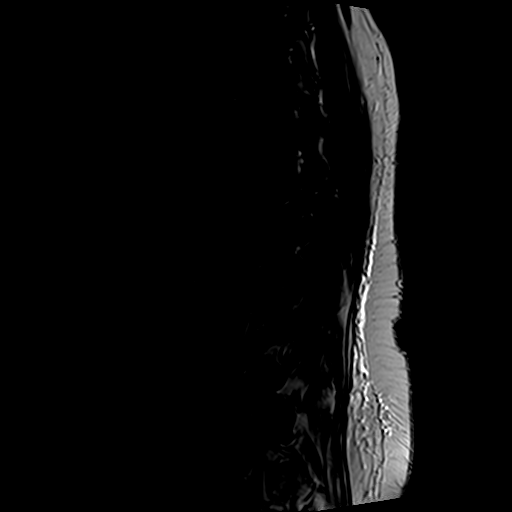
[im 16/16]
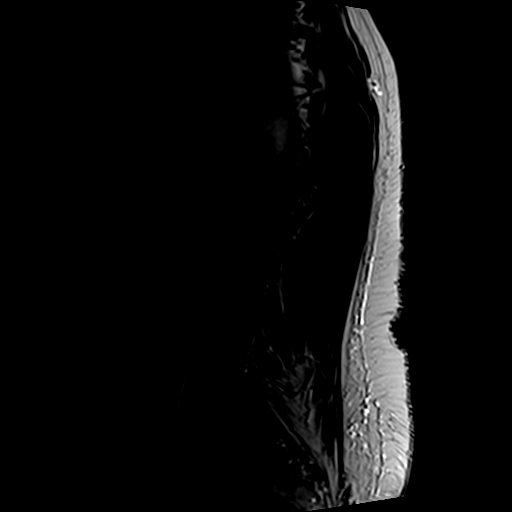

[Series 5: T1 · sagittal · 4.0mm · 0.55mm/px · 6 of 16 slices shown (1 of 2)]
[im 1/16]
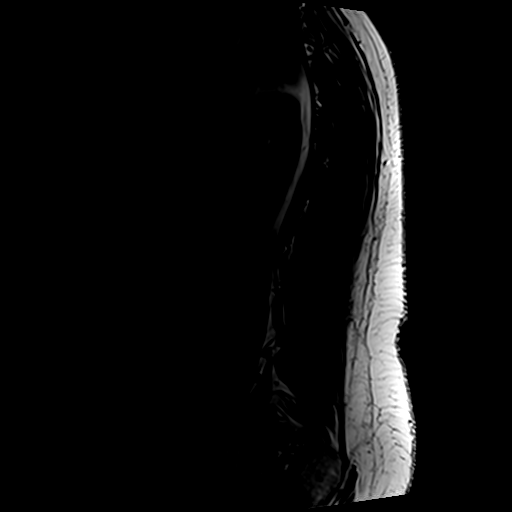
[im 3/16]
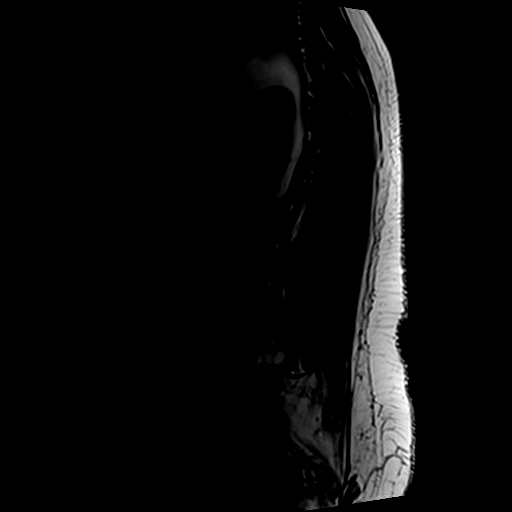
[im 6/16]
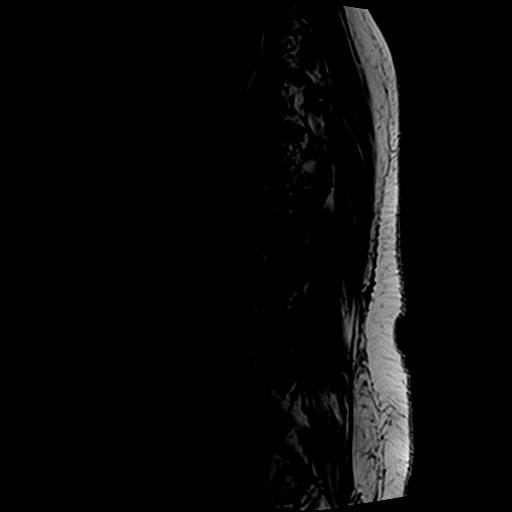
[im 8/16]
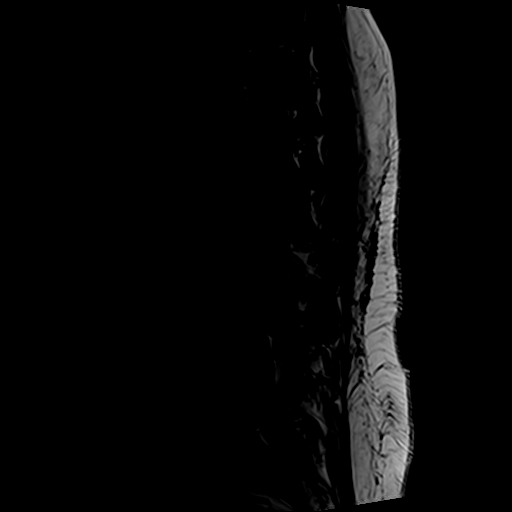
[im 11/16]
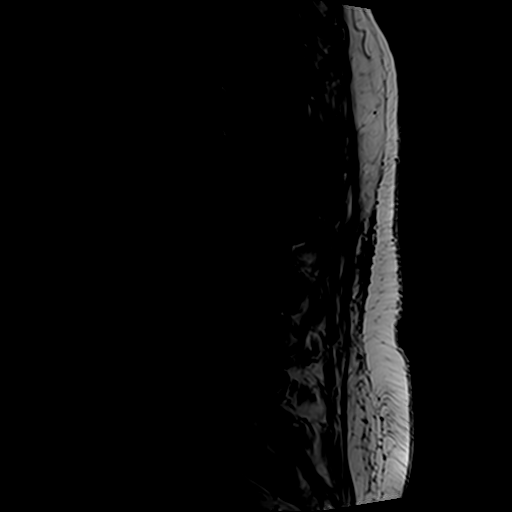
[im 13/16]
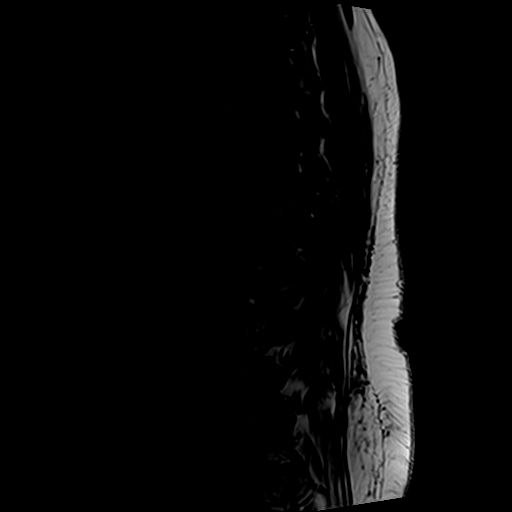

[Series 6: T1 · axial · 4.0mm · 0.35mm/px · z∈[+9,+146]mm · 3 of 33 slices shown (2 of 2)]
[im 5/33]
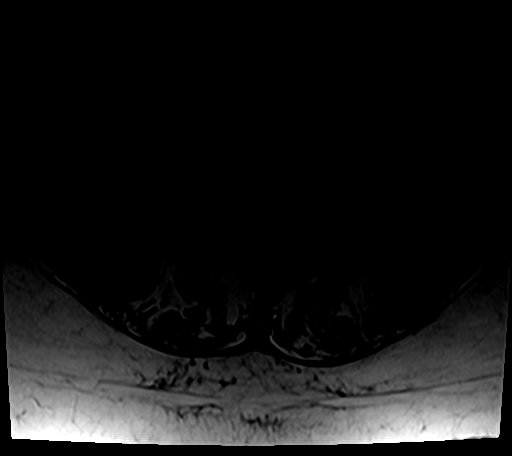
[im 18/33]
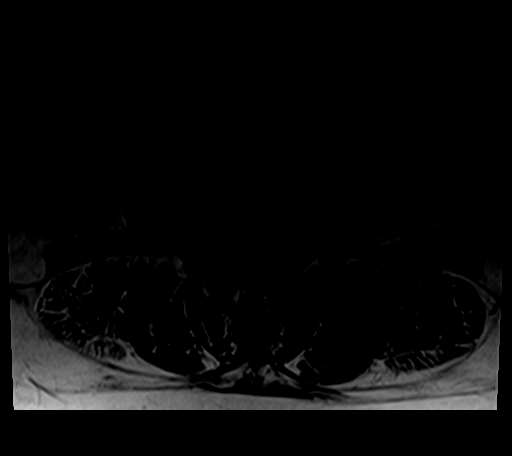
[im 28/33]
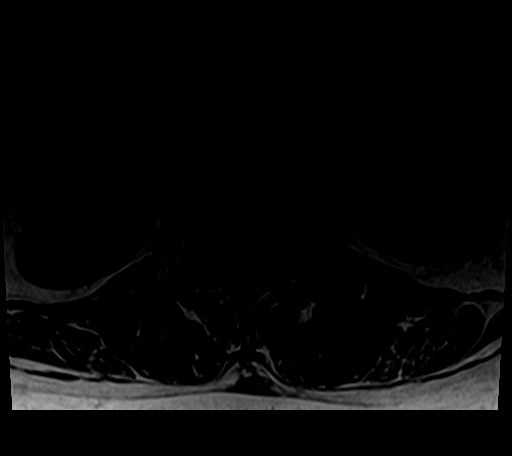

[Series 7: T2 · axial · 4.0mm · 0.70mm/px · z∈[-11,+171]mm · 8 of 33 slices shown]
[im 1/33]
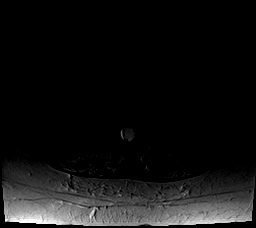
[im 5/33]
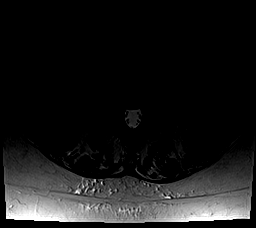
[im 10/33]
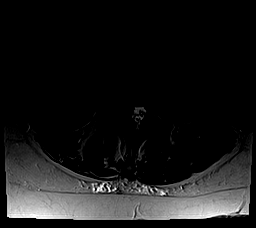
[im 15/33]
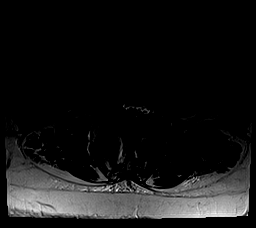
[im 18/33]
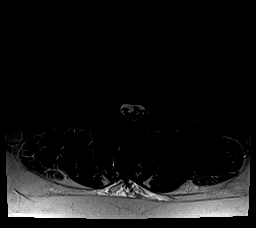
[im 23/33]
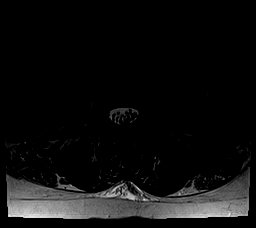
[im 28/33]
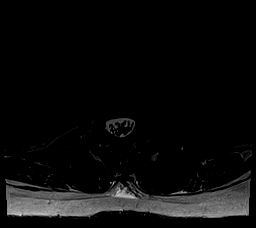
[im 33/33]
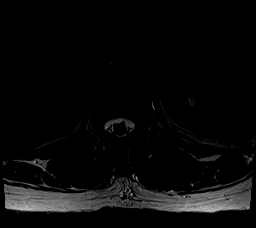

[23 of 48 positions shown; findings below may reference images not displayed]

FINDINGS: Segmentation: Normal lumbar segmentation designated as on the prior
study.

Alignment: Mild lower lumbar levoscoliosis. Trace retrolisthesis of
L4 on L5.

Vertebrae: Chronic L1 superior endplate compression fracture with
moderate to severe anterior vertebral body height loss, unchanged.
No evidence of new fracture or osseous lesion. Degenerative endplate
marrow changes at L4-5 and L5-S1 including mild edema.

Conus medullaris: Extends to the L1-2 level and appears normal.

Paraspinal and other soft tissues: Unremarkable.

Disc levels:

Disc desiccation throughout the lumbar spine. Moderate disc space
narrowing at T12-L1, L4-5, and L5-S1 with mild narrowing elsewhere.

T9-10: Only imaged sagittally. Disc bulging and small central disc
protrusion with mild spinal stenosis.

T10-11: Only imaged sagittally. The mild disc bulging and facet
arthrosis result in mild right neural foraminal stenosis without
significant spinal stenosis, similar to prior.

T11-12: Only imaged sagittally. New disc desiccation, disc space
narrowing, and mild disc bulging without significant stenosis.

T12-L1: Slight L1 superior endplate retropulsion, disc bulging,
endplate spurring, and mild facet arthrosis result in mild spinal
stenosis. The degree of spinal stenosis is similar to the prior
study allowing for different axial scan plane angulation. No
significant neural foraminal stenosis.

L1-2: Progressive disc space narrowing. Increased disc bulging
asymmetric to the left and mild facet hypertrophy result in mild
spinal stenosis and new mild-to-moderate left neural foraminal
stenosis.

L2-3: New disc space narrowing. Progressive circumferential disc
bulging, ligamentum flavum thickening, and severe facet hypertrophy
result in progressive severe spinal stenosis without significant
neural foraminal stenosis.

L3-4: Disc bulging slightly asymmetric to the left has mildly
progressed and in combination with moderate facet and ligamentum
flavum hypertrophy results in moderate spinal stenosis and right
greater than left lateral recess stenosis. The degree of stenosis
has not significantly changed. No significant neural foraminal
stenosis. Left extraforaminal annular fissure.

L4-5: Mildly progressive disc space narrowing. Circumferential disc
bulging and moderate facet hypertrophy result in mild spinal
stenosis, mild right greater than left lateral recess stenosis, and
moderate to severe right and mild-to-moderate left neural foraminal
stenosis. Findings have progressed at this level, particularly the
neural foraminal stenosis.

L5-S1: Mildly progressive disc space narrowing. Circumferential disc
bulging and mild facet hypertrophy result in severe right and
mild-to-moderate left neural foraminal stenosis, progressed from
prior. No spinal stenosis.
IMPRESSION: 1. Progressive diffuse lumbar disc degeneration as above, now with
severe spinal stenosis at L2-3.
2. Severe right neural foraminal stenosis at L5-S1 and moderate to
severe right foraminal stenosis at L4-5.

## 2020-07-25 DIAGNOSIS — R200 Anesthesia of skin: Secondary | ICD-10-CM | POA: Diagnosis not present

## 2020-07-25 DIAGNOSIS — M5416 Radiculopathy, lumbar region: Secondary | ICD-10-CM | POA: Diagnosis not present

## 2020-07-27 DIAGNOSIS — M1711 Unilateral primary osteoarthritis, right knee: Secondary | ICD-10-CM | POA: Diagnosis not present

## 2020-08-21 DIAGNOSIS — E785 Hyperlipidemia, unspecified: Secondary | ICD-10-CM | POA: Diagnosis not present

## 2020-08-21 DIAGNOSIS — R7303 Prediabetes: Secondary | ICD-10-CM | POA: Diagnosis not present

## 2020-08-21 DIAGNOSIS — I10 Essential (primary) hypertension: Secondary | ICD-10-CM | POA: Diagnosis not present

## 2020-08-21 DIAGNOSIS — R7309 Other abnormal glucose: Secondary | ICD-10-CM | POA: Diagnosis not present

## 2020-08-23 DIAGNOSIS — E785 Hyperlipidemia, unspecified: Secondary | ICD-10-CM | POA: Diagnosis not present

## 2020-08-23 DIAGNOSIS — R7303 Prediabetes: Secondary | ICD-10-CM | POA: Diagnosis not present

## 2020-08-23 DIAGNOSIS — Z0000 Encounter for general adult medical examination without abnormal findings: Secondary | ICD-10-CM | POA: Diagnosis not present

## 2020-08-23 DIAGNOSIS — I10 Essential (primary) hypertension: Secondary | ICD-10-CM | POA: Diagnosis not present

## 2020-08-23 DIAGNOSIS — M169 Osteoarthritis of hip, unspecified: Secondary | ICD-10-CM | POA: Diagnosis not present

## 2020-10-10 DIAGNOSIS — Z780 Asymptomatic menopausal state: Secondary | ICD-10-CM | POA: Diagnosis not present

## 2020-10-10 DIAGNOSIS — Z1231 Encounter for screening mammogram for malignant neoplasm of breast: Secondary | ICD-10-CM | POA: Diagnosis not present

## 2020-10-10 DIAGNOSIS — M85832 Other specified disorders of bone density and structure, left forearm: Secondary | ICD-10-CM | POA: Diagnosis not present

## 2021-04-12 DIAGNOSIS — J069 Acute upper respiratory infection, unspecified: Secondary | ICD-10-CM | POA: Diagnosis not present

## 2021-04-18 DIAGNOSIS — R059 Cough, unspecified: Secondary | ICD-10-CM | POA: Diagnosis not present

## 2021-08-26 DIAGNOSIS — E785 Hyperlipidemia, unspecified: Secondary | ICD-10-CM | POA: Diagnosis not present

## 2021-08-26 DIAGNOSIS — Z0000 Encounter for general adult medical examination without abnormal findings: Secondary | ICD-10-CM | POA: Diagnosis not present

## 2021-08-26 DIAGNOSIS — R7303 Prediabetes: Secondary | ICD-10-CM | POA: Diagnosis not present

## 2021-08-26 DIAGNOSIS — R202 Paresthesia of skin: Secondary | ICD-10-CM | POA: Diagnosis not present

## 2021-08-26 DIAGNOSIS — I10 Essential (primary) hypertension: Secondary | ICD-10-CM | POA: Diagnosis not present

## 2021-08-26 DIAGNOSIS — M8580 Other specified disorders of bone density and structure, unspecified site: Secondary | ICD-10-CM | POA: Diagnosis not present

## 2021-08-26 DIAGNOSIS — Z91038 Other insect allergy status: Secondary | ICD-10-CM | POA: Diagnosis not present

## 2021-08-27 ENCOUNTER — Other Ambulatory Visit (HOSPITAL_BASED_OUTPATIENT_CLINIC_OR_DEPARTMENT_OTHER): Payer: Self-pay | Admitting: Family Medicine

## 2021-08-27 DIAGNOSIS — E785 Hyperlipidemia, unspecified: Secondary | ICD-10-CM

## 2021-09-18 ENCOUNTER — Ambulatory Visit (HOSPITAL_BASED_OUTPATIENT_CLINIC_OR_DEPARTMENT_OTHER)
Admission: RE | Admit: 2021-09-18 | Discharge: 2021-09-18 | Disposition: A | Discharge status: Home / Self Care | Payer: Medicare Other | Source: Ambulatory Visit | Attending: Family Medicine | Admitting: Family Medicine

## 2021-09-18 DIAGNOSIS — E785 Hyperlipidemia, unspecified: Secondary | ICD-10-CM

## 2021-09-26 ENCOUNTER — Ambulatory Visit (HOSPITAL_BASED_OUTPATIENT_CLINIC_OR_DEPARTMENT_OTHER)
Admission: RE | Admit: 2021-09-26 | Discharge: 2021-09-26 | Disposition: A | Discharge status: Home / Self Care | Payer: Medicare Other | Source: Ambulatory Visit | Attending: Family Medicine | Admitting: Family Medicine

## 2021-09-26 DIAGNOSIS — E785 Hyperlipidemia, unspecified: Secondary | ICD-10-CM | POA: Insufficient documentation

## 2021-10-16 DIAGNOSIS — Z1231 Encounter for screening mammogram for malignant neoplasm of breast: Secondary | ICD-10-CM | POA: Diagnosis not present

## 2021-10-21 NOTE — Progress Notes (Unsigned)
Cardiology Office Note:    Date:  10/22/2021   ID:  [name], DOB [birth year], MRN [id number]  PCP:  Shon Hale, MD   [facility] Providers Cardiologist:  Maisie Fus, MD     Referring MD: Shon Hale, *   No chief complaint on file. Elevated CAC  History of Present Illness:    [name] is a 76 y.o. female with a hx of HLD,  CAC 401, 83rd percentile. She's started crestor 10 mg at night. She's a non smoker, she tried it briefly in her 32s. She notes toe numbness. No claudication. 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 She walks a lot during the day. She denies DOE or CP. Blood pressures have not been an issue for her. Not checking consistently. No prior cardiac w/u.   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 Reported on 05/29/2015   aspirin EC 81 MG tablet Take 1 tablet (81 mg total) by mouth daily. Swallow whole.   Biotin w/ Vitamins C & E (HAIR SKIN & NAILS GUMMIES PO) Take by mouth daily.   Calcium Citrate-Vitamin D (CALCIUM + D PO) Take 500 Units by mouth. Take 2 tablets daily   Co-Enzyme Q-10 100 MG CAPS 1 capsule   diphenhydrAMINE (BENADRYL) 25 MG tablet Take 1 tablet (25 mg total) by mouth every 6 (six) hours as needed for itching (Rash).   EPINEPHrine (EPIPEN 2-PAK) 0.3 mg/0.3 mL IJ SOAJ injection Inject 0.3 mLs (0.3 mg total) into the muscle once as needed (for severe allergic reaction). CAll 911  immediately if you have to use this medicine   hydrochlorothiazide (HYDRODIURIL) 25 MG tablet    rosuvastatin (CRESTOR) 10 MG tablet Take 10 mg by mouth daily.     Allergies:   Bee venom   Social History   Socioeconomic History   Marital status: Widowed    Spouse name: Not on file   Number of children: Not on file   Years of education: Not on file   Highest education level: Not on file  Occupational History   Not on file  Tobacco Use   Smoking status: Former   Smokeless tobacco: Never   Tobacco comments:    Pt stopped smoking in her 20's  Substance and Sexual Activity   Alcohol use: Yes    Alcohol/week: 10.0 - 14.0 standard drinks of alcohol    Types: 10 - 14 Glasses of wine per week    Comment: occasional    Drug use: No   Sexual activity: Not on file  Other Topics Concern   Not on file  Social History Narrative   Not on file   Social Determinants of Health   Financial Resource Strain: Not on file  Food Insecurity: Not on file  Transportation Needs: Not on file  Physical Activity: Not on file  Stress: Not on file  Social Connections: Not on file     Family History: No family hx of CAD  ROS:   Please see the history of present illness.     All other systems reviewed and are negative.  EKGs/Labs/Other Studies Reviewed:    The following studies were reviewed today:   EKG:  EKG is  ordered today.  The ekg ordered today demonstrates   10/22/2021- NSR  Recent Labs: No results found for requested labs within last 365 days.   Recent Lipid Panel No results found for: "CHOL", "TRIG", "HDL", "CHOLHDL", "VLDL", "LDLCALC", "LDLDIRECT"     Physical Exam:    VS:    Vitals:   10/22/21 0838  BP: 138/80  Pulse: 67  SpO2: 99%     Wt Readings from Last 3 Encounters:  10/22/21 153 lb 9.6 oz (69.7 kg)  05/27/16 160 lb (72.6 kg)  07/04/15 162 lb (73.5 kg)     GEN:  Well nourished, well developed in no acute distress HEENT: Normal NECK: No JVD; No  carotid bruits LYMPHATICS: No lymphadenopathy CARDIAC: RRR, no murmurs, rubs, gallops RESPIRATORY:  Clear to auscultation without rales, wheezing or rhonchi  ABDOMEN: Soft, non-tender, non-distended MUSCULOSKELETAL:  No edema; No deformity  SKIN: Warm and dry NEUROLOGIC:  Alert and oriented x 3 PSYCHIATRIC:  Normal affect   ASSESSMENT:   Elevated CAC: > 75th percentile. LDL goal <70 mg dL, aspirin therapy. Continue crestor 10 mg daily. No anginal symptoms. Recommended ambulatory Bps. continue HCTz 25 mg daily. Goal <130/80 mmHg. PLAN:    In order of problems listed above:  Asa 81 mg daily Fasting lipids in 3 months Follow up 12 months         Medication Adjustments/Labs and Tests Ordered: Current medicines are reviewed at length with the patient today.  Concerns regarding medicines are outlined above.  Orders Placed This Encounter  Procedures   Lipid panel   EKG 12-Lead   Meds ordered this encounter  Medications   aspirin EC 81 MG tablet    Sig: Take 1 tablet (81 mg total) by mouth daily. Swallow whole.    Dispense:  90 tablet    Refill:  3    Patient Instructions  Medication Instructions:  START: ASPIRIN 81mg  ONCE DAILY   *If you need a refill on your cardiac medications before your next appointment, please call your pharmacy*  Lab Work:  Please return for FASTING Blood Work in 3 MONTHS. No appointment needed, lab here at the office is open Monday-Friday from 8AM to 4PM and closed daily for lunch from 12:45-1:45.   If you have labs (blood work) drawn today and your tests are completely normal, you will receive your results only by: MyChart Message (if you have MyChart) OR A paper copy in the mail If you have any lab test that is abnormal or we need to change your treatment, we will call you to review the results.  Follow-Up: At [facility], you and your health needs are our priority.  As part of our continuing mission to provide you with exceptional  heart care, we have created designated Provider Care Teams.  These Care Teams include your primary Cardiologist (physician) and Advanced Practice Providers (APPs -  Physician Assistants and Nurse Practitioners) who all work together to provide you with the care you need, when you need it.  Your next appointment:   1 year(s)  The format for your next appointment:   In Person  Provider:   [facility],  MD            Signed, Maisie Fus, MD  10/22/2021 9:30 AM    [facility]

## 2021-10-22 ENCOUNTER — Encounter: Payer: Self-pay | Admitting: Internal Medicine

## 2021-10-22 ENCOUNTER — Ambulatory Visit: Payer: Medicare Other | Attending: Internal Medicine | Admitting: Internal Medicine

## 2021-10-22 VITALS — BP 138/80 | HR 67 | Ht 63.0 in | Wt 153.6 lb

## 2021-10-22 DIAGNOSIS — R931 Abnormal findings on diagnostic imaging of heart and coronary circulation: Secondary | ICD-10-CM | POA: Diagnosis not present

## 2021-10-22 MED ORDER — ASPIRIN 81 MG PO TBEC: 81.0000 mg | DELAYED_RELEASE_TABLET | Freq: Every day | ORAL | 3 refills | Status: DC

## 2021-10-22 NOTE — Patient Instructions (Signed)
Medication Instructions:  START: ASPIRIN 81mg  ONCE DAILY   *If you need a refill on your cardiac medications before your next appointment, please call your pharmacy*  Lab Work:  Please return for FASTING Blood Work in 3 MONTHS. No appointment needed, lab here at the office is open Monday-Friday from 8AM to 4PM and closed daily for lunch from 12:45-1:45.   If you have labs (blood work) drawn today and your tests are completely normal, you will receive your results only by: MyChart Message (if you have MyChart) OR A paper copy in the mail If you have any lab test that is abnormal or we need to change your treatment, we will call you to review the results.  Follow-Up: At [facility], you and your health needs are our priority.  As part of our continuing mission to provide you with exceptional heart care, we have created designated Provider Care Teams.  These Care Teams include your primary Cardiologist (physician) and Advanced Practice Providers (APPs -  Physician Assistants and Nurse Practitioners) who all work together to provide you with the care you need, when you need it.  Your next appointment:   1 year(s)  The format for your next appointment:   In Person  Provider:   [facility], MD

## 2021-12-09 ENCOUNTER — Other Ambulatory Visit: Payer: Self-pay | Admitting: Family Medicine

## 2021-12-09 DIAGNOSIS — R202 Paresthesia of skin: Secondary | ICD-10-CM

## 2021-12-09 DIAGNOSIS — R6889 Other general symptoms and signs: Secondary | ICD-10-CM

## 2021-12-17 ENCOUNTER — Ambulatory Visit
Admission: RE | Admit: 2021-12-17 | Discharge: 2021-12-17 | Disposition: A | Discharge status: Home / Self Care | Payer: Medicare Other | Source: Ambulatory Visit | Attending: Family Medicine | Admitting: Family Medicine

## 2021-12-17 DIAGNOSIS — R6889 Other general symptoms and signs: Secondary | ICD-10-CM

## 2021-12-17 DIAGNOSIS — I739 Peripheral vascular disease, unspecified: Secondary | ICD-10-CM | POA: Diagnosis not present

## 2021-12-17 DIAGNOSIS — R202 Paresthesia of skin: Secondary | ICD-10-CM

## 2021-12-30 DIAGNOSIS — S0993XA Unspecified injury of face, initial encounter: Secondary | ICD-10-CM | POA: Diagnosis not present

## 2021-12-30 DIAGNOSIS — S0011XA Contusion of right eyelid and periocular area, initial encounter: Secondary | ICD-10-CM | POA: Diagnosis not present

## 2021-12-30 DIAGNOSIS — S0012XA Contusion of left eyelid and periocular area, initial encounter: Secondary | ICD-10-CM | POA: Diagnosis not present

## 2022-01-15 ENCOUNTER — Encounter: Payer: Self-pay | Admitting: Vascular Surgery

## 2022-01-15 ENCOUNTER — Ambulatory Visit: Payer: Medicare Other | Admitting: Vascular Surgery

## 2022-01-15 VITALS — BP 170/97 | HR 67 | Temp 98.2°F | Resp 20 | Ht 63.0 in | Wt 155.0 lb

## 2022-01-15 DIAGNOSIS — R200 Anesthesia of skin: Secondary | ICD-10-CM

## 2022-01-15 NOTE — Progress Notes (Signed)
+  Patient ID: [name], female   DOB: Sep 06, 1945, 76 y.o.   MRN: [id number]  Reason for Consult: New Patient (Initial Visit)   Referred by Shon Hale, *  Subjective:     HPI:  [name] is a 76 y.o. female with history of numbness in her toes which occurs mostly when she is standing.  She really does not have any frank pain in her legs.  She has never had any vascular issues.  She does have hyperlipidemia no history of coronary artery disease.  Denies tissue loss or ulceration.  No stroke, TIA or amaurosis.  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  Past Medical History:  Diagnosis Date   Hyperlipidemia    History reviewed. No pertinent family history. 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   Procedure: TOTAL LEFT HIP ARTHROPLASTY ANTERIOR APPROACH;  Surgeon: Ollen Gross, MD;  Location: [facility];  Service: Orthopedics;  Laterality: Left;   VEIN SURGERY     varicose in left leg    Short Social History:  Social History   Tobacco Use   Smoking status: Former   Smokeless tobacco: Never   Tobacco comments:    Pt stopped smoking in her 20's  Substance Use Topics   Alcohol use: Yes    Alcohol/week: 10.0 - 14.0 standard drinks of alcohol    Types: 10 - 14 Glasses of wine per week    Comment: occasional     Allergies  Allergen Reactions   Bee Venom     Allergic to" Paper Wasps"-caused swelling     Current Outpatient Medications  Medication Sig Dispense Refill   acetaminophen (TYLENOL) 500 MG tablet Take 1,000 mg by mouth 2 (two) times daily as needed for mild pain. Reported on 05/29/2015     aspirin EC 81 MG tablet Take 1 tablet (81 mg total) by mouth daily. Swallow whole. 90 tablet 3   Biotin w/ Vitamins C & E (HAIR SKIN & NAILS GUMMIES PO) Take by mouth daily.     Calcium Citrate-Vitamin D (CALCIUM + D PO) Take 500 Units by  mouth. Take 2 tablets daily     Co-Enzyme Q-10 100 MG CAPS 1 capsule     diphenhydrAMINE (BENADRYL) 25 MG tablet Take 1 tablet (25 mg total) by mouth every 6 (six) hours as needed for itching (Rash). 30 tablet 0   EPINEPHrine (EPIPEN 2-PAK) 0.3 mg/0.3 mL IJ SOAJ injection Inject 0.3 mLs (0.3 mg total) into the muscle once as needed (for severe allergic reaction). CAll 911 immediately if you have to use this medicine 1 Device 1   hydrochlorothiazide (HYDRODIURIL) 25 MG tablet      rosuvastatin (CRESTOR) 10 MG tablet Take 10 mg by mouth daily.     No current facility-administered medications for this visit.    Review of Systems  Constitutional:  Constitutional negative. HENT: HENT negative.  Eyes: Eyes negative.  Respiratory: Respiratory negative.  Cardiovascular: Cardiovascular negative.  GI: Gastrointestinal negative.  Musculoskeletal: Musculoskeletal negative.  Skin: Skin negative.  Neurological: Positive for numbness.  Hematologic: Hematologic/lymphatic negative.  Psychiatric: Psychiatric negative.        Objective:  Objective  Vitals:   01/15/22 1052  BP: (!) 170/97  Pulse: 67  Resp: 20  Temp: 98.2 F (36.8 C)  SpO2: 98%     Physical  Exam HENT:     Head: Normocephalic.     Nose: Nose normal.  Eyes:     Pupils: Pupils are equal, round, and reactive to light.  Cardiovascular:     Rate and Rhythm: Normal rate.     Pulses: Normal pulses.  Abdominal:     General: Abdomen is flat.     Palpations: Abdomen is soft.  Musculoskeletal:        General: Normal range of motion.     Cervical back: Normal range of motion and neck supple.     Right lower leg: No edema.     Left lower leg: No edema.  Skin:    General: Skin is warm.     Capillary Refill: Capillary refill takes less than 2 seconds.  Neurological:     General: No focal deficit present.     Mental Status: She is alert.  Psychiatric:        Mood and Affect: Mood normal.        Behavior: Behavior normal.         Thought Content: Thought content normal.        Judgment: Judgment normal.     Data: Right Lower Extremity   ABI: 1.1   Inflow: Normal common femoral arterial waveforms and velocities. No evidence of inflow (aortoiliac) disease.   Outflow: Normal profunda femoral, superficial femoral and popliteal arterial waveforms and velocities. No focal elevation of the PSV to suggest stenosis.   Runoff: Normal posterior and anterior tibial arterial waveforms and velocities. Vessels are patent to the ankle.   Left Lower Extremity   ABI: 1.1   Inflow: Normal common femoral arterial waveforms and velocities. No evidence of inflow (aortoiliac) disease.   Outflow: Normal profunda femoral, superficial femoral and popliteal arterial waveforms and velocities. No focal elevation of the PSV to suggest stenosis.   Runoff: Normal posterior and anterior tibial arterial waveforms and velocities. Vessels are patent to the ankle.   However, diminished pressure is present in the great toes bilaterally along with decreased pulse volume recordings in the digits.   IMPRESSION: No evidence of hemodynamically significant peripheral arterial disease in either lower extremity to the level of the ankles.   However, there is evidence of significant small vessel disease with decreased perfusion to the great toes bilaterally worse on the left than the right.     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  I have recommended protecting her feet with adequate shoes and avoiding barefoot walking given her microvascular disease but she does have palpable pulses in the does not require any vascular intervention we will follow-up in to see me on an as-needed basis.     Claire Sandy MD [facility]

## 2022-02-07 DIAGNOSIS — E782 Mixed hyperlipidemia: Secondary | ICD-10-CM | POA: Diagnosis not present

## 2022-02-07 DIAGNOSIS — R931 Abnormal findings on diagnostic imaging of heart and coronary circulation: Secondary | ICD-10-CM | POA: Diagnosis not present

## 2022-02-07 LAB — LIPID PANEL
Chol/HDL Ratio: 2.5 ratio (ref 0.0–4.4)
Cholesterol, Total: 178 mg/dL (ref 100–199)
HDL: 72 mg/dL (ref >39)
LDL Chol Calc (NIH): 87 mg/dL (ref 0–99)
Triglycerides: 107 mg/dL (ref 0–149)
VLDL Cholesterol Cal: 19 mg/dL (ref 5–40)

## 2022-02-12 ENCOUNTER — Other Ambulatory Visit: Payer: Self-pay

## 2022-02-12 MED ORDER — ROSUVASTATIN CALCIUM 20 MG PO TABS: 20.0000 mg | ORAL_TABLET | Freq: Every day | ORAL | 3 refills | Status: DC

## 2022-06-27 DIAGNOSIS — M5416 Radiculopathy, lumbar region: Secondary | ICD-10-CM | POA: Diagnosis not present

## 2022-07-22 ENCOUNTER — Telehealth: Payer: Self-pay | Admitting: Internal Medicine

## 2022-07-22 NOTE — Telephone Encounter (Signed)
Patient c/o Palpitations:  High priority if patient c/o lightheadedness, shortness of breath, or chest pain  How long have you had palpitations/irregular HR/ Afib? Are you having the symptoms now? No afib at this  time- had a few episodes while she was on a cruise-   Are you currently experiencing lightheadedness, SOB or CP? no  Do you have a history of afib (atrial fibrillation) or irregular heart rhythm?   Have you checked your BP or HR? (document readings if available): a little high when she had the episode   Are you experiencing any other symptoms? No- I made her an appointment for 08-20-22- please call to evaluate

## 2022-07-22 NOTE — Telephone Encounter (Signed)
Spoke to patient she stated she had palpitations while on recent trip.Stated she did a lot of walking.She drank more caffeine.Stated she did not feel palpitations.She noticed on her watch her heart beat was irregular.She felt ok.She requested appointment.Appointment scheduled with Claire Levering NP 6/19 at 8:50 am.

## 2022-07-26 ENCOUNTER — Other Ambulatory Visit: Payer: Self-pay

## 2022-07-26 ENCOUNTER — Encounter (HOSPITAL_BASED_OUTPATIENT_CLINIC_OR_DEPARTMENT_OTHER): Payer: Self-pay | Admitting: Emergency Medicine

## 2022-07-26 ENCOUNTER — Emergency Department (HOSPITAL_BASED_OUTPATIENT_CLINIC_OR_DEPARTMENT_OTHER)
Admission: EM | Admit: 2022-07-26 | Discharge: 2022-07-26 | Disposition: A | Discharge status: Home / Self Care | Payer: Medicare Other | Attending: Emergency Medicine | Admitting: Emergency Medicine

## 2022-07-26 ENCOUNTER — Emergency Department (HOSPITAL_BASED_OUTPATIENT_CLINIC_OR_DEPARTMENT_OTHER): Payer: Medicare Other

## 2022-07-26 DIAGNOSIS — Z7982 Long term (current) use of aspirin: Secondary | ICD-10-CM | POA: Diagnosis not present

## 2022-07-26 DIAGNOSIS — I480 Paroxysmal atrial fibrillation: Secondary | ICD-10-CM | POA: Diagnosis not present

## 2022-07-26 DIAGNOSIS — J9811 Atelectasis: Secondary | ICD-10-CM | POA: Diagnosis not present

## 2022-07-26 DIAGNOSIS — R739 Hyperglycemia, unspecified: Secondary | ICD-10-CM | POA: Diagnosis not present

## 2022-07-26 DIAGNOSIS — R009 Unspecified abnormalities of heart beat: Secondary | ICD-10-CM | POA: Insufficient documentation

## 2022-07-26 DIAGNOSIS — Z01818 Encounter for other preprocedural examination: Secondary | ICD-10-CM | POA: Diagnosis not present

## 2022-07-26 LAB — CBC WITH DIFFERENTIAL/PLATELET
Abs Immature Granulocytes: 0.01 10*3/uL (ref 0.00–0.07)
Basophils Absolute: 0 10*3/uL (ref 0.0–0.1)
Basophils Relative: 0 %
Eosinophils Absolute: 0.1 10*3/uL (ref 0.0–0.5)
Eosinophils Relative: 1 %
HCT: 45.8 % (ref 36.0–46.0)
Hemoglobin: 16 g/dL — ABNORMAL HIGH (ref 12.0–15.0)
Immature Granulocytes: 0 %
Lymphocytes Relative: 31 %
Lymphs Abs: 1.6 10*3/uL (ref 0.7–4.0)
MCH: 32.1 pg (ref 26.0–34.0)
MCHC: 34.9 g/dL (ref 30.0–36.0)
MCV: 91.8 fL (ref 80.0–100.0)
Monocytes Absolute: 0.4 10*3/uL (ref 0.1–1.0)
Monocytes Relative: 8 %
Neutro Abs: 3.1 10*3/uL (ref 1.7–7.7)
Neutrophils Relative %: 60 %
Platelets: 278 10*3/uL (ref 150–400)
RBC: 4.99 MIL/uL (ref 3.87–5.11)
RDW: 14.4 % (ref 11.5–15.5)
WBC: 5.1 10*3/uL (ref 4.0–10.5)
nRBC: 0 % (ref 0.0–0.2)

## 2022-07-26 LAB — BASIC METABOLIC PANEL
Anion gap: 11 (ref 5–15)
BUN: 15 mg/dL (ref 8–23)
CO2: 24 mmol/L (ref 22–32)
Calcium: 9.1 mg/dL (ref 8.9–10.3)
Chloride: 96 mmol/L — ABNORMAL LOW (ref 98–111)
Creatinine, Ser: 0.73 mg/dL (ref 0.44–1.00)
GFR, Estimated: >60 mL/min (ref >60)
Glucose, Bld: 169 mg/dL — ABNORMAL HIGH (ref 70–99)
Potassium: 3.8 mmol/L (ref 3.5–5.1)
Sodium: 131 mmol/L — ABNORMAL LOW (ref 135–145)

## 2022-07-26 LAB — TSH: TSH: 2.009 u[IU]/mL (ref 0.350–4.500)

## 2022-07-26 MED ORDER — APIXABAN 5 MG PO TABS: 5.0000 mg | ORAL_TABLET | Freq: Two times a day (BID) | ORAL | 0 refills | Status: DC

## 2022-07-26 MED ORDER — LACTATED RINGERS IV SOLN: INTRAVENOUS | Status: DC

## 2022-07-26 NOTE — ED Triage Notes (Signed)
Pt arrives pov, steady gait,   endorses that apple watch on 3 separate occasions, 2 weeks ago, ~ 4 days ago and last night alerted "afib" and tachycardia. Pt denies CP or shob

## 2022-07-26 NOTE — ED Provider Notes (Signed)
[place] EMERGENCY DEPARTMENT AT [facility] Provider Note   CSN: [id number] Arrival date & time: 07/26/22  0736     History  Chief Complaint  Patient presents with   Irregular Heart Beat    [name] is a 77 y.o. female.  20 old female presents with irregular heartbeat.  Patient states that her watch told her that she was in A-fib with a high heart rate.  States that she has had this happen 3 times over the past week or so.  Patient is asymptomatic when she has this defecation from her watch.  She has no associated chest pain or chest pressure.  She is not short of breath.  Has no prior history of A-fib.  Has appointment scheduled to see her cardiologist in 4 days.  Currently asymptomatic.  Last episode was earlier this morning       Home Medications Prior to Admission medications   Medication Sig Start Date End Date Taking? Authorizing Provider  acetaminophen (TYLENOL) 500 MG tablet Take 1,000 mg by mouth 2 (two) times daily as needed for mild pain. Reported on 05/29/2015    [provider]  aspirin EC 81 MG tablet Take 1 tablet (81 mg total) by mouth daily. Swallow whole. 10/22/21   Maisie Fus, MD  Biotin w/ Vitamins C & E (HAIR SKIN & NAILS GUMMIES PO) Take by mouth daily.    [provider]  Calcium Citrate-Vitamin D (CALCIUM + D PO) Take 500 Units by mouth. Take 2 tablets daily    [provider]  Co-Enzyme Q-10 100 MG CAPS 1 capsule    [provider]  diphenhydrAMINE (BENADRYL) 25 MG tablet Take 1 tablet (25 mg total) by mouth every 6 (six) hours as needed for itching (Rash). 05/27/16   Antony Madura, PA-C  EPINEPHrine (EPIPEN 2-PAK) 0.3 mg/0.3 mL IJ SOAJ injection Inject 0.3 mLs (0.3 mg total) into the muscle once as needed (for severe allergic reaction). CAll 911 immediately if you have to use this medicine 05/27/16   Antony Madura, PA-C  hydrochlorothiazide (HYDRODIURIL) 25 MG tablet     [provider]   rosuvastatin (CRESTOR) 20 MG tablet Take 1 tablet (20 mg total) by mouth daily. 02/12/22   Maisie Fus, MD      Allergies    Bee venom    Review of Systems   Review of Systems  All other systems reviewed and are negative.   Physical Exam Updated Vital Signs BP 131/65   Pulse 62   Temp 97.9 F (36.6 C) (Oral)   Resp 18   Ht 1.6 m (5\' 3" )   Wt 68.5 kg   SpO2 99%   BMI 26.75 kg/m  Physical Exam Vitals and nursing note reviewed.  Constitutional:      General: She is not in acute distress.    Appearance: Normal appearance. She is well-developed. She is not toxic-appearing.  HENT:     Head: Normocephalic and atraumatic.  Eyes:     General: Lids are normal.     Conjunctiva/sclera: Conjunctivae normal.     Pupils: Pupils are equal, round, and reactive to light.  Neck:     Thyroid: No thyroid mass.     Trachea: No tracheal deviation.  Cardiovascular:     Rate and Rhythm: Normal rate and regular rhythm.     Heart sounds: Normal heart sounds. No murmur heard.    No gallop.  Pulmonary:     Effort: Pulmonary effort is normal.  No respiratory distress.     Breath sounds: Normal breath sounds. No stridor. No decreased breath sounds, wheezing, rhonchi or rales.  Abdominal:     General: There is no distension.     Palpations: Abdomen is soft.     Tenderness: There is no abdominal tenderness. There is no rebound.  Musculoskeletal:        General: No tenderness. Normal range of motion.     Cervical back: Normal range of motion and neck supple.  Skin:    General: Skin is warm and dry.     Findings: No abrasion or rash.  Neurological:     Mental Status: She is alert and oriented to person, place, and time. Mental status is at baseline.     GCS: GCS eye subscore is 4. GCS verbal subscore is 5. GCS motor subscore is 6.     Cranial Nerves: No cranial nerve deficit.     Sensory: No sensory deficit.     Motor: Motor function is intact.  Psychiatric:        Attention and  Perception: Attention normal.        Speech: Speech normal.        Behavior: Behavior normal.     ED Results / Procedures / Treatments   Labs (all labs ordered are listed, but only abnormal results are displayed) Labs Reviewed - No data to display  EKG EKG Interpretation  Date/Time:  Saturday July 26 2022 07:53:20 EDT Ventricular Rate:  73 PR Interval:  142 QRS Duration: 68 QT Interval:  377 QTC Calculation: 416 R Axis:   115 Text Interpretation: Sinus rhythm Right axis deviation Low voltage, precordial leads Confirmed by Lorre Nick ([id number]) on 07/26/2022 8:16:47 AM  Radiology No results found.  Procedures Procedures    Medications Ordered in ED Medications  lactated ringers infusion (has no administration in time range)    ED Course/ Medical Decision Making/ A&P                             Medical Decision Making Amount and/or Complexity of Data Reviewed Labs: ordered. Radiology: ordered.  Risk Prescription drug management.   Patient's electrolytes are significant only for mild hyperglycemia at 169.  Thyroid function studies are within normal limits.  Her EKG per interpretation shows normal sinus rhythm.  Has been monitored here for several hours and no recurrence of her A-fib.  I did look at the patient's Apple Watch and it did say that she was in A-fib but there was no tracing that I could find.  Discussed case with cardiologist on-call, Dr. Lourena Simmonds, he recommended patient stop her aspirin and be placed on Eliquis 5 mg p.o. twice daily.  Patient already has a scheduled follow-up in 5 days.  Strict return precautions given        Final Clinical Impression(s) / ED Diagnoses Final diagnoses:  None    Rx / DC Orders ED Discharge Orders     None         Lorre Nick, MD 07/26/22 [phone]

## 2022-07-26 NOTE — ED Notes (Signed)
[place] for tracel

## 2022-07-26 NOTE — Discharge Instructions (Signed)
Keep your appointment with the cardiologist for Thursday.  Go to [facility] if your heart rate should increase again

## 2022-07-30 ENCOUNTER — Ambulatory Visit: Payer: Medicare Other | Admitting: Student

## 2022-07-31 ENCOUNTER — Ambulatory Visit: Payer: Medicare Other | Attending: Student | Admitting: Student

## 2022-07-31 ENCOUNTER — Ambulatory Visit (INDEPENDENT_AMBULATORY_CARE_PROVIDER_SITE_OTHER): Payer: Medicare Other

## 2022-07-31 ENCOUNTER — Encounter: Payer: Self-pay | Admitting: Student

## 2022-07-31 VITALS — BP 138/82 | HR 78 | Ht 63.0 in | Wt 153.8 lb

## 2022-07-31 DIAGNOSIS — R002 Palpitations: Secondary | ICD-10-CM

## 2022-07-31 DIAGNOSIS — E785 Hyperlipidemia, unspecified: Secondary | ICD-10-CM

## 2022-07-31 DIAGNOSIS — I2510 Atherosclerotic heart disease of native coronary artery without angina pectoris: Secondary | ICD-10-CM

## 2022-07-31 NOTE — Progress Notes (Unsigned)
Enrolled for [employer] to mail a ZIO XT long term holter monitor to the patients address on file.   Dr. Tereso Newcomer to read

## 2022-07-31 NOTE — Patient Instructions (Addendum)
Medication Instructions:  Your physician recommends that you continue on your current medications as directed. Please refer to the Current Medication list given to you today.  *If you need a refill on your cardiac medications before your next appointment, please call your pharmacy*   Lab Work: NONE ordered at this time of appointment     Testing/Procedures: ZIO AT Long term monitor-Live Telemetry  Your physician has requested you wear a ZIO patch monitor for 14 days.  This is a single patch monitor. [employer] supplies one patch monitor per enrollment. Additional  stickers are not available.  Please do not apply patch if you will be having a Nuclear Stress Test, Echocardiogram, Cardiac CT, MRI,  or Chest Xray during the period you would be wearing the monitor. The patch cannot be worn during  these tests. You cannot remove and re-apply the ZIO AT patch monitor.  Your ZIO patch monitor will be mailed 3 day USPS to your address on file. It may take 3-5 days to  receive your monitor after you have been enrolled.  Once you have received your monitor, please review the enclosed instructions. Your monitor has  already been registered assigning a specific monitor serial # to you.   Billing and Patient Assistance Program information  Meredeth Ide has been supplied with any insurance information on record for billing. [employer] offers a sliding scale Patient Assistance Program for patients without insurance, or whose  insurance does not completely cover the cost of the ZIO patch monitor. You must apply for the  Patient Assistance Program to qualify for the discounted rate. To apply, call [employer] at [phone],  select option 4, select option 2 , ask to apply for the Patient Assistance Program, (you can request an  interpreter if needed). [employer] will ask your household income and how many people are in your  household. [employer] will quote your out-of-pocket cost based on this information. They will  also be able  to set up a 12 month interest free payment plan if needed.  Applying the monitor   Shave hair from upper left chest.  Hold the abrader disc by orange tab. Rub the abrader in 40 strokes over left upper chest as indicated in  your monitor instructions.  Clean area with 4 enclosed alcohol pads. Use all pads to ensure the area is cleaned thoroughly. Let  dry.  Apply patch as indicated in monitor instructions. Patch will be placed under collarbone on left side of  chest with arrow pointing upward.  Rub patch adhesive wings for 2 minutes. Remove the white label marked "1". Remove the white label  marked "2". Rub patch adhesive wings for 2 additional minutes.  While looking in a mirror, press and release button in center of patch. A small green light will flash 3-4  times. This will be your only indicator that the monitor has been turned on.  Do not shower for the first 24 hours. You may shower after the first 24 hours.  Press the button if you feel a symptom. You will hear a small click. Record Date, Time and Symptom in  the Patient Log.   Starting the Gateway  In your kit there is a Audiological scientist box the size of a cellphone. This is Buyer, retail. It transmits all your  recorded data to [facility]. This box must always stay within 10 feet of you. Open the box and push the *  button. There will be a light that blinks orange and then green a few  times. When the light stops  blinking, the Gateway is connected to the ZIO patch. Call [employer] at [phone] to confirm your monitor is transmitting.  Returning your monitor  Remove your patch and place it inside the Gateway. In the lower half of the Gateway there is a white  bag with prepaid postage on it. Place Gateway in bag and seal. Mail package back to [place] as soon as  possible. Your physician should have your final report approximately 7 days after you have mailed back  your monitor. Call [facility] Customer Care at  [phone] if you have questions regarding your ZIO AT  patch monitor. Call them immediately if you see an orange light blinking on your monitor.  If your monitor falls off in less than 4 days, contact our Monitor department at [phone]. If your  monitor becomes loose or falls off after 4 days call [employer] at [phone] for suggestions on  securing your monitor    Follow-Up: At [facility], you and your health needs are our priority.  As part of our continuing mission to provide you with exceptional heart care, we have created designated Provider Care Teams.  These Care Teams include your primary Cardiologist (physician) and Advanced Practice Providers (APPs -  Physician Assistants and Nurse Practitioners) who all work together to provide you with the care you need, when you need it.  We recommend signing up for the patient portal called "MyChart".  Sign up information is provided on this After Visit Summary.  MyChart is used to connect with patients for Virtual Visits (Telemedicine).  Patients are able to view lab/test results, encounter notes, upcoming appointments, etc.  Non-urgent messages can be sent to your provider as well.   To learn more about what you can do with MyChart, go to [email or web address].    Your next appointment:   6 week(s)  Provider:   Carlos Levering or any APP    Other Instructions

## 2022-07-31 NOTE — Progress Notes (Signed)
Cardiology Clinic Note   Date: 07/31/2022 ID: Libertie, [place] Sep 28, 1945, MRN [id number]  Primary Cardiologist:  Maisie Fus, MD  Patient Profile    [name] is a 77 y.o. female who presents to the clinic today for evaluation of palpitations.    Past medical history significant for: Nonobstructive CAD. CT cardiac scoring 09/26/2021: Coronary calcium score 401 (83rd percentile). Hyperlipidemia. Lipid panel 02/07/2022: LDL 87, HDL 72, TG 107, total 178. Palpitations.     History of Present Illness    [name] patient was first evaluated by Dr. Wyline Mood on 10/22/2021 for elevated calcium score at the request of Dr. Chanetta Marshall.  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  She denied DOE or chest pain.  She was started on aspirin.  Patient contacted the office on 07/22/2022 with complaints of palpitations.  Per conversation with triage "Spoke to patient she stated she had palpitations while on recent trip.Stated she did a lot of walking.She drank more caffeine.Stated she did not feel palpitations.She noticed on her watch her heart beat was irregular.She felt ok.She requested appointment."  Patient presented to the ED on 07/26/2022 with complaints of being alerted for A-fib with high heart rate (160 bpm) on her Apple Watch.  She was asymptomatic.  EKG showed NSR.  Cardiology was consulted and patient was started on Eliquis.  Today, patient is accompanied by her daughter.  She denies any further alerts from her Apple Watch.  She denies ever feeling palpitations.  She states her first 2 episodes of her work while she was on a cruise in the setting of increased caffeine, increased alcohol intake possibly some dehydration secondary to all of the activities.  She otherwise rarely consumes alcohol and currently has 1 cup of coffee a day. Patient denies shortness of breath or dyspnea on exertion. No chest pain, pressure, or tightness. Denies lower extremity  edema, orthopnea, or PND.  She performs stretching exercises every morning.  She does a lot of pottery which requires her to walk a lot.  She typically gets 5000+ steps per day.  She is tolerating Eliquis without difficulty.  No blood in stool or urine or other bleeding concerns.    ROS: All other systems reviewed and are otherwise negative except as noted in History of Present Illness.  Studies Reviewed       ECG performed in the ED on 07/26/2022 personally reviewed by me today: NSR, 73 bpm.  No significant changes from 10/22/2021.  Risk Assessment/Calculations     CHA2DS2-VASc Score = 3   This indicates a 3.2% annual risk of stroke. The patient's score is based upon: CHF History: 0 HTN History: 0 Diabetes History: 0 Stroke History: 0 Vascular Disease History: 0 Age Score: 2 Gender Score: 1             Physical Exam    VS:  BP 138/82   Pulse 78   Ht 5\' 3"  (1.6 m)   Wt 153 lb 12.8 oz (69.8 kg)   SpO2 96%   BMI 27.24 kg/m  , BMI Body mass index is 27.24 kg/m.  GEN: Well nourished, well developed, in no acute distress. Neck: No JVD or carotid bruits. Cardiac:  RRR. No murmurs. No rubs or gallops.   Respiratory:  Respirations regular and unlabored. Clear to auscultation without rales, wheezing or rhonchi. GI: Soft, nontender, nondistended. Extremities: Radials/DP/PT 2+ and equal bilaterally. No clubbing or cyanosis. No edema.  Skin: Warm and dry, no rash.  Neuro: Strength intact.  Assessment & Plan    Palpitations.  Patient contacted the office on 07/22/2022 with complaints of palpitations.  She was evaluated in the ED on 07/26/2018 for after her Apple Watch alerted her for A-fib.  Cardiology was consulted and she was started on Eliquis.  Patient denies cardiac awareness of arrhythmias.  She has had no further alerts from her Apple Watch.  She did not have the app to her phone downloaded so she has no strips.  She denies spontaneous bleeding concerns.  EKG performed in the  ED on 07/27/2022 is reviewed and shows NSR.  She has regular rate and rhythm today so EKG was not repeated.  Labs performed in ED were also reviewed.  TSH normal at 2.009.  No signs of infection or anemia.  Kidney function normal.  No electrolyte derangement.  Will get 14-day ZIO. Nonobstructive CAD.  Coronary calcium score August 2023 401.  Patient denies chest pain, tightness, or pressure.  She is active doing pottery and getting 5000+ steps a day.  She also performs stretching exercises every morning.  Continue Crestor. Hyperlipidemia.  LDL December 2023 87.  Continue Crestor.  Disposition: 2-week ZIO.  Return in 6 weeks or sooner as needed.         Signed, Etta Grandchild. Keelie Zemanek, DNP, NP-C

## 2022-08-04 DIAGNOSIS — R002 Palpitations: Secondary | ICD-10-CM | POA: Diagnosis not present

## 2022-08-19 ENCOUNTER — Telehealth: Payer: Self-pay | Admitting: Internal Medicine

## 2022-08-19 DIAGNOSIS — I4891 Unspecified atrial fibrillation: Secondary | ICD-10-CM

## 2022-08-19 NOTE — Telephone Encounter (Signed)
*  STAT* If patient is at the pharmacy, call can be transferred to refill team.   1. Which medications need to be refilled? (please list name of each medication and dose if known) apixaban (ELIQUIS) 5 MG TABS tablet   2. Which pharmacy/location (including street and city if local pharmacy) is medication to be sent to? [id number] - [place], [place] - [address] AT [address]    3. Do they need a 30 day or 90 day supply? 90   Patient states that pharmacy will not refill medication without hearing from our office.

## 2022-08-20 ENCOUNTER — Ambulatory Visit: Payer: Medicare Other | Admitting: Adult Health

## 2022-08-20 DIAGNOSIS — R7303 Prediabetes: Secondary | ICD-10-CM | POA: Insufficient documentation

## 2022-08-20 DIAGNOSIS — I10 Essential (primary) hypertension: Secondary | ICD-10-CM | POA: Insufficient documentation

## 2022-08-20 DIAGNOSIS — E785 Hyperlipidemia, unspecified: Secondary | ICD-10-CM | POA: Insufficient documentation

## 2022-08-20 DIAGNOSIS — M8580 Other specified disorders of bone density and structure, unspecified site: Secondary | ICD-10-CM | POA: Insufficient documentation

## 2022-08-20 MED ORDER — APIXABAN 5 MG PO TABS: 5.0000 mg | ORAL_TABLET | Freq: Two times a day (BID) | ORAL | 0 refills | Status: DC

## 2022-08-20 NOTE — Telephone Encounter (Signed)
Prescription refill request for Eliquis received. Indication: a fib Last office visit: 07/31/22 Scr: 0.73 epic 07/26/22 Age: 77 Weight: 69kg

## 2022-08-25 NOTE — Progress Notes (Signed)
Tsh ordered due to concern for thyroid strom

## 2022-08-26 DIAGNOSIS — R002 Palpitations: Secondary | ICD-10-CM | POA: Diagnosis not present

## 2022-09-03 DIAGNOSIS — E785 Hyperlipidemia, unspecified: Secondary | ICD-10-CM | POA: Diagnosis not present

## 2022-09-03 DIAGNOSIS — M8580 Other specified disorders of bone density and structure, unspecified site: Secondary | ICD-10-CM | POA: Diagnosis not present

## 2022-09-03 DIAGNOSIS — Z0000 Encounter for general adult medical examination without abnormal findings: Secondary | ICD-10-CM | POA: Diagnosis not present

## 2022-09-03 DIAGNOSIS — I700 Atherosclerosis of aorta: Secondary | ICD-10-CM | POA: Diagnosis not present

## 2022-09-03 DIAGNOSIS — R7303 Prediabetes: Secondary | ICD-10-CM | POA: Diagnosis not present

## 2022-09-03 DIAGNOSIS — D6869 Other thrombophilia: Secondary | ICD-10-CM | POA: Diagnosis not present

## 2022-09-03 DIAGNOSIS — Z91038 Other insect allergy status: Secondary | ICD-10-CM | POA: Diagnosis not present

## 2022-09-03 DIAGNOSIS — I10 Essential (primary) hypertension: Secondary | ICD-10-CM | POA: Diagnosis not present

## 2022-09-03 DIAGNOSIS — Z9181 History of falling: Secondary | ICD-10-CM | POA: Diagnosis not present

## 2022-09-03 DIAGNOSIS — I480 Paroxysmal atrial fibrillation: Secondary | ICD-10-CM | POA: Diagnosis not present

## 2022-09-08 NOTE — Progress Notes (Unsigned)
Cardiology Clinic Note   Date: 09/09/2022 ID: Chasitty, Guilbert 03-07-1945, MRN [id number]  Primary Cardiologist:  Maisie Fus, MD  Patient Profile    [name] is a 77 y.o. female who presents to the clinic today for follow up after heart monitor.     Past medical history significant for: Nonobstructive CAD. CT cardiac scoring 09/26/2021: Coronary calcium score 401 (83rd percentile). Hyperlipidemia. Lipid panel 09/03/2022: LDL 78, HDL 55, TG 118, total 154. Palpitations.  PAF. 14-day ZIO 08/27/2022: Predominant underlying rhythm was sinus rhythm.  A-fib occurred <1%, longest lasting 41 seconds.  Rare PACs/PVCs.     History of Present Illness    [name] was first evaluated by Dr. Wyline Mood on 10/22/2021 for elevated calcium score at the request of Dr. Chanetta Marshall. 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 She denied DOE or chest pain. She was started on aspirin.   Patient contacted the office on 07/22/2022 with complaints of palpitations.  Per conversation with triage "Spoke to patient she stated she had palpitations while on recent trip.Stated she did a lot of walking.She drank more caffeine.Stated she did not feel palpitations.She noticed on her watch her heart beat was irregular.She felt ok.She requested appointment."   Patient presented to the ED on 07/26/2022 with complaints of being alerted for A-fib with high heart rate (160 bpm) on her Apple Watch.  She was asymptomatic.  EKG showed NSR.  Cardiology was consulted and patient was started on Eliquis.  Patient was last seen in the office on 07/31/2022 by me for hospital follow-up.  She denied any further alerts from her Apple Watch at that time.  She had regular rate and rhythm on exam.  14-day ZIO showed <1% A-fib burden.  Today, patient is doing well. Patient denies shortness of breath or dyspnea on exertion. No chest pain, pressure, or tightness. Denies lower extremity edema, orthopnea, or PND.  No palpitations. She has not been alerted by Apple Watch for afib. She has no cardiac awareness of arrhythmia. She is very active as a Veterinary surgeon. On the weekends she loads her car with pottery, unloads at the [employer], reloads her car, and unloads at home without shortness of breath. No blood in stool or urine or other bleeding concerns.       ROS: All other systems reviewed and are otherwise negative except as noted in History of Present Illness.  Studies Reviewed    EKG Interpretation Date/Time:  Tuesday September 09 2022 15:05:00 EDT Ventricular Rate:  64 PR Interval:  162 QRS Duration:  78 QT Interval:  422 QTC Calculation: 435 R Axis:   87  Text Interpretation: Normal sinus rhythm Low voltage QRS When compared with ECG of 26-Jul-2022 07:53, PREVIOUS ECG IS PRESENT Confirmed by Carlos Levering [phone]) on 09/09/2022 3:22:05 PM    Risk Assessment/Calculations     CHA2DS2-VASc Score = 3   This indicates a 3.2% annual risk of stroke. The patient's score is based upon: CHF History: 0 HTN History: 0 Diabetes History: 0 Stroke History: 0 Vascular Disease History: 0 Age Score: 2 Gender Score: 1             Physical Exam    VS:  BP (!) 162/88   Pulse 64   Ht 5\' 3"  (1.6 m)   Wt 155 lb (70.3 kg)   SpO2 97%   BMI 27.46 kg/m  , BMI Body mass index is 27.46 kg/m.  GEN: Well nourished, well developed, in  no acute distress. Neck: No JVD or carotid bruits. Cardiac:  RRR. No murmurs. No rubs or gallops.   Respiratory:  Respirations regular and unlabored. Clear to auscultation without rales, wheezing or rhonchi. GI: Soft, nontender, nondistended. Extremities: Radials/DP/PT 2+ and equal bilaterally. No clubbing or cyanosis. No edema.  Skin: Warm and dry, no rash. Neuro: Strength intact.  Assessment & Plan    PAF.  14-day ZIO showed <1% A-fib burden.  Patient denies alerts from Apple Watch for afib. She is NSR on EKG today. Denies spontaneous bleeding concerns.  Continue  Eliquis. Nonobstructive CAD.  Coronary calcium score August 2023 401.  Patient denies chest pain, tightness, or pressure.  She is active doing pottery and getting 5000+ steps a day.  She also performs stretching exercises every morning.  Continue Crestor. Hypertension: BP today 162/88 on intake and 136/80 on my recheck.  Patient denies headaches, dizziness or vision changes. Continue hydrochlorothiazide. Hyperlipidemia.  LDL July 2024 78, decreased from December 2023.  Continue Crestor.  Disposition: Return in 6 months or sooner as needed.          Signed, Etta Grandchild. Deshun Sedivy, DNP, NP-C

## 2022-09-09 ENCOUNTER — Encounter: Payer: Self-pay | Admitting: Student

## 2022-09-09 ENCOUNTER — Ambulatory Visit: Payer: Medicare Other | Admitting: Student

## 2022-09-09 VITALS — BP 136/80 | HR 64 | Ht 63.0 in | Wt 155.0 lb

## 2022-09-09 DIAGNOSIS — I480 Paroxysmal atrial fibrillation: Secondary | ICD-10-CM

## 2022-09-09 DIAGNOSIS — E782 Mixed hyperlipidemia: Secondary | ICD-10-CM

## 2022-09-09 DIAGNOSIS — I2510 Atherosclerotic heart disease of native coronary artery without angina pectoris: Secondary | ICD-10-CM | POA: Diagnosis not present

## 2022-09-09 DIAGNOSIS — R931 Abnormal findings on diagnostic imaging of heart and coronary circulation: Secondary | ICD-10-CM

## 2022-09-09 NOTE — Patient Instructions (Signed)
Medication Instructions:  No changes *If you need a refill on your cardiac medications before your next appointment, please call your pharmacy*   Lab Work: none If you have labs (blood work) drawn today and your tests are completely normal, you will receive your results only by: MyChart Message (if you have MyChart) OR A paper copy in the mail If you have any lab test that is abnormal or we need to change your treatment, we will call you to review the results.   Testing/Procedures: none   Follow-Up: At [facility], you and your health needs are our priority.  As part of our continuing mission to provide you with exceptional heart care, we have created designated Provider Care Teams.  These Care Teams include your primary Cardiologist (physician) and Advanced Practice Providers (APPs -  Physician Assistants and Nurse Practitioners) who all work together to provide you with the care you need, when you need it.  We recommend signing up for the patient portal called "MyChart".  Sign up information is provided on this After Visit Summary.  MyChart is used to connect with patients for Virtual Visits (Telemedicine).  Patients are able to view lab/test results, encounter notes, upcoming appointments, etc.  Non-urgent messages can be sent to your provider as well.   To learn more about what you can do with MyChart, go to [email or web address].    Your next appointment:   6 month(s)  Provider:   Maisie Fus, MD

## 2022-10-17 ENCOUNTER — Ambulatory Visit: Payer: Medicare Other | Admitting: Internal Medicine

## 2022-10-22 DIAGNOSIS — Z96643 Presence of artificial hip joint, bilateral: Secondary | ICD-10-CM | POA: Diagnosis not present

## 2022-10-22 DIAGNOSIS — Z1231 Encounter for screening mammogram for malignant neoplasm of breast: Secondary | ICD-10-CM | POA: Diagnosis not present

## 2022-10-22 DIAGNOSIS — M8588 Other specified disorders of bone density and structure, other site: Secondary | ICD-10-CM | POA: Diagnosis not present

## 2022-11-12 ENCOUNTER — Other Ambulatory Visit: Payer: Self-pay | Admitting: Internal Medicine

## 2022-11-12 DIAGNOSIS — I4891 Unspecified atrial fibrillation: Secondary | ICD-10-CM

## 2022-11-12 NOTE — Telephone Encounter (Signed)
Prescription refill request for Eliquis received. Indication: Afib  Last office visit: 09/09/22 ([place])  Scr: 0.73 (07/26/22)  Age: 77 Weight: 70.3kg  Appropriate dose. Refill sent.

## 2023-01-14 DIAGNOSIS — J209 Acute bronchitis, unspecified: Secondary | ICD-10-CM | POA: Diagnosis not present

## 2023-01-14 DIAGNOSIS — R058 Other specified cough: Secondary | ICD-10-CM | POA: Diagnosis not present

## 2023-02-07 ENCOUNTER — Other Ambulatory Visit: Payer: Self-pay | Admitting: Internal Medicine

## 2023-02-27 ENCOUNTER — Other Ambulatory Visit: Payer: Self-pay

## 2023-02-27 DIAGNOSIS — I4891 Unspecified atrial fibrillation: Secondary | ICD-10-CM

## 2023-02-27 MED ORDER — APIXABAN 5 MG PO TABS: 5.0000 mg | ORAL_TABLET | Freq: Two times a day (BID) | ORAL | 1 refills | Status: DC

## 2023-02-27 MED ORDER — ROSUVASTATIN CALCIUM 20 MG PO TABS: 20.0000 mg | ORAL_TABLET | Freq: Every day | ORAL | 1 refills | Status: DC

## 2023-02-27 NOTE — Telephone Encounter (Signed)
Prescription refill request for Eliquis received. Indication:afib Last office visit:7/24 Scr:0.73  6/24 Age: 78 Weight:70.3  kg  Prescription refilled

## 2023-03-18 ENCOUNTER — Ambulatory Visit: Payer: Medicare Other | Attending: Internal Medicine | Admitting: Internal Medicine

## 2023-03-18 ENCOUNTER — Encounter: Payer: Self-pay | Admitting: Internal Medicine

## 2023-03-18 VITALS — BP 128/78 | HR 60 | Ht 63.0 in | Wt 157.0 lb

## 2023-03-18 DIAGNOSIS — E782 Mixed hyperlipidemia: Secondary | ICD-10-CM | POA: Diagnosis not present

## 2023-03-18 DIAGNOSIS — I10 Essential (primary) hypertension: Secondary | ICD-10-CM | POA: Diagnosis not present

## 2023-03-18 MED ORDER — DILTIAZEM HCL 30 MG PO TABS: 30.0000 mg | ORAL_TABLET | Freq: Four times a day (QID) | ORAL | 3 refills | Status: AC | PRN

## 2023-03-18 NOTE — Patient Instructions (Signed)
 Medication Instructions:  Diltiazem  30 mg every 6 hours as needed for palpitations  *If you need a refill on your cardiac medications before your next appointment, please call your pharmacy*   Lab Work: Fasting lipids  If you have labs (blood work) drawn today and your tests are completely normal, you will receive your results only by: MyChart Message (if you have MyChart) OR A paper copy in the mail If you have any lab test that is abnormal or we need to change your treatment, we will call you to review the results.   Testing/Procedures: None    Follow-Up: At [facility], you and your health needs are our priority.  As part of our continuing mission to provide you with exceptional heart care, we have created designated Provider Care Teams.  These Care Teams include your primary Cardiologist (physician) and Advanced Practice Providers (APPs -  Physician Assistants and Nurse Practitioners) who all work together to provide you with the care you need, when you need it.     Your next appointment:   6 month(s)  Provider:   With any APP   Other Instructions

## 2023-03-18 NOTE — Progress Notes (Signed)
 Cardiology Office Note:    Date:  03/18/2023   ID:  [name], DOB 03/16/1945, MRN [id number]  PCP:  Chrystal Lamarr RAMAN, MD   [facility] Providers Cardiologist:  Alvan Ronal BRAVO, MD     Referring MD: Chrystal Lamarr RAMAN, *   No chief complaint on file. Elevated CAC  History of Present Illness:    [name] is a 78 y.o. female with a hx of HLD,  CAC 401, 83rd percentile. She's started crestor  10 mg at night. She's a non smoker, she tried it briefly in her 72s. She notes toe numbness. No claudication. 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 She walks a lot during the day. She denies DOE or CP. Blood pressures have not been an issue for her. Not checking consistently. No prior cardiac w/u.   Interim hx 03/18/2023 She feels well. She is asymptomatic. Medications are going well.  She is busy. 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 Aims to get 5,000 steps in.  She notes some palpitations here and there but not persistent.  She was diagnosed with atrial fibrillation last June  Current Medications: Current Outpatient Medications on File Prior to Visit  Medication Sig Dispense Refill   apixaban  (ELIQUIS ) 5 MG TABS tablet Take 1 tablet (5 mg total) by mouth 2 (two) times daily. 180 tablet 1   Co-Enzyme Q-10 100 MG CAPS 1 capsule     EPINEPHrine  (EPIPEN  2-PAK) 0.3 mg/0.3 mL IJ SOAJ injection Inject 0.3 mLs (0.3 mg total) into the muscle once as needed (for severe allergic reaction). CAll 911 immediately if you have to use this medicine 1 Device 1   hydrochlorothiazide (HYDRODIURIL) 25 MG tablet      rosuvastatin  (CRESTOR ) 20 MG tablet Take 1 tablet (20 mg total) by mouth daily. 90 tablet 1   No current facility-administered medications on file prior to visit.    CHA2DS2-VASc Score = 3   This indicates a 3.2% annual risk of stroke. The patient's score is based upon: CHF History: 0 HTN History: 0 Diabetes History: 0 Stroke History: 0 Vascular Disease History:  0 Age Score: 2 Gender Score: 1      Allergies:   Bee venom   Family History: No family hx of CAD  ROS:   Please see the history of present illness.     All other systems reviewed and are negative.  EKGs/Labs/Other Studies Reviewed:    The following studies were reviewed today:   EKG:  EKG is  ordered today.  The ekg ordered today demonstrates   10/22/2021- NSR  Recent Labs: 07/26/2022: BUN 15; Creatinine, Ser 0.73; Hemoglobin 16.0; Platelets 278; Potassium 3.8; Sodium 131; TSH 2.009   Recent Lipid Panel    Component Value Date/Time   CHOL 178 02/07/2022 0850   TRIG 107 02/07/2022 0850   HDL 72 02/07/2022 0850   CHOLHDL 2.5 02/07/2022 0850   LDLCALC 87 02/07/2022 0850       Physical Exam:    VS:   Vitals:   03/18/23 1116  BP: 128/78  Pulse: 60  SpO2: 93%    Wt Readings from Last 3 Encounters:  09/09/22 155 lb (70.3 kg)  07/31/22 153 lb 12.8 oz (69.8 kg)  07/26/22 151 lb (68.5 kg)     GEN:  Well nourished, well developed in no acute distress HEENT: Normal NECK: No JVD; No carotid bruits LYMPHATICS: No lymphadenopathy CARDIAC: RRR, no murmurs, rubs, gallops RESPIRATORY:  Clear to auscultation without rales, wheezing or  rhonchi  ABDOMEN: Soft, non-tender, non-distended MUSCULOSKELETAL:  No edema; No deformity  SKIN: Warm and dry NEUROLOGIC:  Alert and oriented x 3 PSYCHIATRIC:  Normal affect   ASSESSMENT and PLAN  Elevated CAC: > 75th percentile. LDL goal <70 mg dL, aspirin  therapy. Continue crestor  20 mg daily. No anginal symptoms. Recommended ambulatory Bps. continue HCTz 25 mg daily. Goal <130/80 mmHg. Cont asa 81 mg daily -Fasting lipids  Paroxysmal Afib <1% burden diagnosed on a cardiac monitor in June 2024 after she noted A-fib from her Apple Watch.  She has been relatively asymptomatic.  If symptoms progress will get an echocardiogram. -Continue Eliquis  5 mg twice daily --->diltiazem  30 mg q6H PRN for palpitations DISPO:   In order of  problems listed above: Fasting lipids Follow up 6 months with an APP         Medication Adjustments/Labs and Tests Ordered: Current medicines are reviewed at length with the patient today.  Concerns regarding medicines are outlined above.  No orders of the defined types were placed in this encounter.  No orders of the defined types were placed in this encounter.   There are no Patient Instructions on file for this visit.   Signed, Alvan Ronal BRAVO, MD  03/18/2023 10:40 AM    [facility]

## 2023-03-23 DIAGNOSIS — E782 Mixed hyperlipidemia: Secondary | ICD-10-CM | POA: Diagnosis not present

## 2023-03-24 ENCOUNTER — Encounter: Payer: Self-pay | Admitting: Internal Medicine

## 2023-03-24 LAB — LIPID PANEL
Chol/HDL Ratio: 2.2 {ratio} (ref 0.0–4.4)
Cholesterol, Total: 136 mg/dL (ref 100–199)
HDL: 62 mg/dL (ref >39)
LDL Chol Calc (NIH): 60 mg/dL (ref 0–99)
Triglycerides: 72 mg/dL (ref 0–149)
VLDL Cholesterol Cal: 14 mg/dL (ref 5–40)

## 2023-05-08 ENCOUNTER — Other Ambulatory Visit: Payer: Self-pay | Admitting: Internal Medicine

## 2023-05-08 DIAGNOSIS — I4891 Unspecified atrial fibrillation: Secondary | ICD-10-CM

## 2023-05-11 NOTE — Telephone Encounter (Signed)
 Prescription refill request for Eliquis received. Indication:palps Last office visit:2/25 Scr:0.73  6/24 Age: 78 Weight:71.2  kg  Prescription refilled

## 2023-06-08 ENCOUNTER — Telehealth: Payer: Self-pay

## 2023-06-08 NOTE — Telephone Encounter (Addendum)
 Triage/Advice: -pt called stating she had seen someone at our office about the numbness in her feet/toes.  She states it wakes her up at night, wants to know if we can prescribe something. -attempted call-picked up & unable to hear.  -returned call - pt states her numbness has worsened over the last 1.5 years and progressed to whole foot and even to ankle or above.  Waking her up at night has also been a change so she thought it would be a good idea to have it checked out. -ABI's scheduled w/ PA visit.  pt advised to f/u with PCP for tx options of chronic numbness as well.

## 2023-06-09 ENCOUNTER — Other Ambulatory Visit: Payer: Self-pay

## 2023-06-09 DIAGNOSIS — R200 Anesthesia of skin: Secondary | ICD-10-CM

## 2023-06-25 ENCOUNTER — Other Ambulatory Visit: Payer: Self-pay | Admitting: Internal Medicine

## 2023-06-25 DIAGNOSIS — I4891 Unspecified atrial fibrillation: Secondary | ICD-10-CM

## 2023-06-29 ENCOUNTER — Ambulatory Visit (INDEPENDENT_AMBULATORY_CARE_PROVIDER_SITE_OTHER)

## 2023-06-29 ENCOUNTER — Ambulatory Visit: Admitting: Podiatry

## 2023-06-29 ENCOUNTER — Encounter: Payer: Self-pay | Admitting: Podiatry

## 2023-06-29 VITALS — Ht 63.0 in | Wt 157.0 lb

## 2023-06-29 DIAGNOSIS — Q742 Other congenital malformations of lower limb(s), including pelvic girdle: Secondary | ICD-10-CM

## 2023-06-29 DIAGNOSIS — M21622 Bunionette of left foot: Secondary | ICD-10-CM | POA: Diagnosis not present

## 2023-06-29 DIAGNOSIS — M21621 Bunionette of right foot: Secondary | ICD-10-CM

## 2023-07-07 DIAGNOSIS — M1711 Unilateral primary osteoarthritis, right knee: Secondary | ICD-10-CM | POA: Diagnosis not present

## 2023-07-11 DIAGNOSIS — I10 Essential (primary) hypertension: Secondary | ICD-10-CM | POA: Diagnosis not present

## 2023-07-11 DIAGNOSIS — E785 Hyperlipidemia, unspecified: Secondary | ICD-10-CM | POA: Diagnosis not present

## 2023-07-11 DIAGNOSIS — I480 Paroxysmal atrial fibrillation: Secondary | ICD-10-CM | POA: Diagnosis not present

## 2023-07-14 ENCOUNTER — Other Ambulatory Visit: Payer: Self-pay

## 2023-07-14 ENCOUNTER — Other Ambulatory Visit: Payer: Self-pay | Admitting: *Deleted

## 2023-07-14 DIAGNOSIS — I4891 Unspecified atrial fibrillation: Secondary | ICD-10-CM

## 2023-07-14 MED ORDER — APIXABAN 5 MG PO TABS: 5.0000 mg | ORAL_TABLET | Freq: Two times a day (BID) | ORAL | 1 refills | Status: DC

## 2023-07-14 MED ORDER — ROSUVASTATIN CALCIUM 20 MG PO TABS: 20.0000 mg | ORAL_TABLET | Freq: Every day | ORAL | 2 refills | Status: DC

## 2023-07-14 NOTE — Telephone Encounter (Signed)
 Eliquis  5mg  refill request received. Patient is 78 years old, weight-71.2kg, Crea-0.56 on 09/03/22 via [facility] from [place], [place], and last seen by Dr. Alois Arnt on 03/18/23. Dose is appropriate based on dosing criteria. Will send in refill to requested pharmacy.    Refill under DOD Addie Holstein today.

## 2023-07-23 NOTE — Progress Notes (Signed)
 Subjective:   Patient ID: [name], female   DOB: 78 y.o.   MRN: [id number]   HPI Patient presents concerned about pain in the outside of both feet stating they can bother her and also that she has several toes that have unusual deformities in them long-term.  Patient does not smoke likes to be active   Review of Systems  All other systems reviewed and are negative.       Objective:  Physical Exam Vitals and nursing note reviewed.  Constitutional:      Appearance: She is well-developed.  Pulmonary:     Effort: Pulmonary effort is normal.   Musculoskeletal:        General: Normal range of motion.   Skin:    General: Skin is warm.   Neurological:     Mental Status: She is alert.     Neurovascular status intact muscle strength found to be adequate range of motion adequate fluid was noted slight and ankle with good muscle strength.  Patient has slight inflammation around the fifth MPJ bilateral fluid buildup and has discomfort to a mild nature along with toes that have abnormal webbing that has been throughout her life.  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  At 1 point consideration of treatment of other areas if symptoms do not get better with conservative treatment  X-rays were negative for signs of abnormal bone structure around the digits with mild tailor bunion deformity bilateral

## 2023-07-27 ENCOUNTER — Ambulatory Visit

## 2023-07-27 ENCOUNTER — Encounter (HOSPITAL_COMMUNITY)

## 2023-08-05 DIAGNOSIS — S0511XA Contusion of eyeball and orbital tissues, right eye, initial encounter: Secondary | ICD-10-CM | POA: Diagnosis not present

## 2023-08-05 DIAGNOSIS — H5789 Other specified disorders of eye and adnexa: Secondary | ICD-10-CM | POA: Diagnosis not present

## 2023-08-10 DIAGNOSIS — E785 Hyperlipidemia, unspecified: Secondary | ICD-10-CM | POA: Diagnosis not present

## 2023-08-10 DIAGNOSIS — I480 Paroxysmal atrial fibrillation: Secondary | ICD-10-CM | POA: Diagnosis not present

## 2023-08-10 DIAGNOSIS — I10 Essential (primary) hypertension: Secondary | ICD-10-CM | POA: Diagnosis not present

## 2023-09-10 DIAGNOSIS — I10 Essential (primary) hypertension: Secondary | ICD-10-CM | POA: Diagnosis not present

## 2023-09-10 DIAGNOSIS — I480 Paroxysmal atrial fibrillation: Secondary | ICD-10-CM | POA: Diagnosis not present

## 2023-09-10 DIAGNOSIS — E785 Hyperlipidemia, unspecified: Secondary | ICD-10-CM | POA: Diagnosis not present

## 2023-09-21 ENCOUNTER — Other Ambulatory Visit: Payer: Self-pay | Admitting: Cardiology

## 2023-09-21 DIAGNOSIS — I4891 Unspecified atrial fibrillation: Secondary | ICD-10-CM

## 2023-09-21 NOTE — Telephone Encounter (Signed)
 Prescription refill request for Eliquis  received. Indication:PALPS Last office visit:2/25 Scr:0.73  2024 Age: 78 Weight:71.2  KG  PRESCRIPTION REFILLED

## 2023-09-29 DIAGNOSIS — D6869 Other thrombophilia: Secondary | ICD-10-CM | POA: Diagnosis not present

## 2023-09-29 DIAGNOSIS — Z23 Encounter for immunization: Secondary | ICD-10-CM | POA: Diagnosis not present

## 2023-09-29 DIAGNOSIS — I480 Paroxysmal atrial fibrillation: Secondary | ICD-10-CM | POA: Diagnosis not present

## 2023-09-29 DIAGNOSIS — I10 Essential (primary) hypertension: Secondary | ICD-10-CM | POA: Diagnosis not present

## 2023-09-29 DIAGNOSIS — R7303 Prediabetes: Secondary | ICD-10-CM | POA: Diagnosis not present

## 2023-09-29 DIAGNOSIS — Z0000 Encounter for general adult medical examination without abnormal findings: Secondary | ICD-10-CM | POA: Diagnosis not present

## 2023-09-29 DIAGNOSIS — R490 Dysphonia: Secondary | ICD-10-CM | POA: Diagnosis not present

## 2023-09-29 DIAGNOSIS — E785 Hyperlipidemia, unspecified: Secondary | ICD-10-CM | POA: Diagnosis not present

## 2023-09-29 DIAGNOSIS — Z91038 Other insect allergy status: Secondary | ICD-10-CM | POA: Diagnosis not present

## 2023-09-29 DIAGNOSIS — M8580 Other specified disorders of bone density and structure, unspecified site: Secondary | ICD-10-CM | POA: Diagnosis not present

## 2023-10-11 DIAGNOSIS — E785 Hyperlipidemia, unspecified: Secondary | ICD-10-CM | POA: Diagnosis not present

## 2023-10-11 DIAGNOSIS — I10 Essential (primary) hypertension: Secondary | ICD-10-CM | POA: Diagnosis not present

## 2023-10-11 DIAGNOSIS — I480 Paroxysmal atrial fibrillation: Secondary | ICD-10-CM | POA: Diagnosis not present

## 2023-10-28 DIAGNOSIS — Z1231 Encounter for screening mammogram for malignant neoplasm of breast: Secondary | ICD-10-CM | POA: Diagnosis not present

## 2023-11-10 DIAGNOSIS — I10 Essential (primary) hypertension: Secondary | ICD-10-CM | POA: Diagnosis not present

## 2023-11-10 DIAGNOSIS — I480 Paroxysmal atrial fibrillation: Secondary | ICD-10-CM | POA: Diagnosis not present

## 2023-11-10 DIAGNOSIS — E785 Hyperlipidemia, unspecified: Secondary | ICD-10-CM | POA: Diagnosis not present

## 2023-12-30 ENCOUNTER — Other Ambulatory Visit: Payer: Self-pay | Admitting: Student

## 2024-03-04 ENCOUNTER — Encounter: Payer: Self-pay | Admitting: Internal Medicine

## 2024-03-04 ENCOUNTER — Ambulatory Visit: Attending: Internal Medicine | Admitting: Internal Medicine

## 2024-03-04 VITALS — BP 116/66 | HR 69 | Ht 63.0 in | Wt 155.6 lb

## 2024-03-04 DIAGNOSIS — I10 Essential (primary) hypertension: Secondary | ICD-10-CM | POA: Diagnosis not present

## 2024-03-04 DIAGNOSIS — I4891 Unspecified atrial fibrillation: Secondary | ICD-10-CM | POA: Diagnosis not present

## 2024-03-04 DIAGNOSIS — E782 Mixed hyperlipidemia: Secondary | ICD-10-CM | POA: Diagnosis not present

## 2024-03-04 MED ORDER — ROSUVASTATIN CALCIUM 20 MG PO TABS: 20.0000 mg | ORAL_TABLET | Freq: Every day | ORAL | 3 refills | Status: AC

## 2024-03-04 MED ORDER — HYDROCHLOROTHIAZIDE 25 MG PO TABS: 25.0000 mg | ORAL_TABLET | Freq: Every day | ORAL | 3 refills | Status: AC

## 2024-03-04 NOTE — Patient Instructions (Signed)
 Medication Instructions:  NO CHANGES *If you need a refill on your cardiac medications before your next appointment, please call your pharmacy*  Lab Work: FASTING LIPID PANEL IN 1 YEAR PRIOR TO FOLLOW UP If you have labs (blood work) drawn today and your tests are completely normal, you will receive your results only by: MyChart Message (if you have MyChart) OR A paper copy in the mail If you have any lab test that is abnormal or we need to change your treatment, we will call you to review the results.  Testing/Procedures:[address] Your physician has requested that you have an echocardiogram. Echocardiography is a painless test that uses sound waves to create images of your heart. It provides your doctor with information about the size and shape of your heart and how well your hearts chambers and valves are working. This procedure takes approximately one hour. There are no restrictions for this procedure. Please do NOT wear cologne, perfume, aftershave, or lotions (deodorant is allowed). Please arrive 15 minutes prior to your appointment time.  Please note: We ask at that you not bring children with you during ultrasound (echo/ vascular) testing. Due to room size and safety concerns, children are not allowed in the ultrasound rooms during exams. Our front office staff cannot provide observation of children in our lobby area while testing is being conducted. An adult accompanying a patient to their appointment will only be allowed in the ultrasound room at the discretion of the ultrasound technician under special circumstances. We apologize for any inconvenience.   Follow-Up: At [facility], you and your health needs are our priority.  As part of our continuing mission to provide you with exceptional heart care, our providers are all part of one team.  This team includes your primary Cardiologist (physician) and Advanced Practice Providers or APPs (Physician Assistants and Nurse  Practitioners) who all work together to provide you with the care you need, when you need it.  Your next appointment:   1 year(s)  Provider:   Emeline FORBES Calender, DO   Other Instructions

## 2024-03-04 NOTE — Progress Notes (Signed)
 " Cardiology Office Note:  .   Date:  03/04/2024  ID:  [name], DOB 07-01-1945, MRN [id number] PCP: Chrystal Lamarr RAMAN, MD  [facility] Providers Cardiologist:  Emeline FORBES Calender, DO    History of Present Illness: .     Discussed the use of AI scribe software for clinical note transcription with the patient, who gave verbal consent to proceed.  History of Present Illness [name] is a 79 year old female with hyperlipidemia and paroxysmal atrial fibrillation who presents for a six-month follow-up.  Dyslipidemia and cardiovascular risk - Coronary calcium  score of 401, corresponding to the 83rd percentile for age and sex - Lipid panel from March 23, 2023: total cholesterol 136 mg/dL, HDL 62 mg/dL, LDL 60 mg/dL, triglycerides 72 mg/dL  Paroxysmal atrial fibrillation - Paroxysmal atrial fibrillation with less than 1% burden on cardiac monitoring - Episodes occur approximately five times, often asymptomatic and detected by wearable device alerts - No palpitations or symptoms during most episodes - Diltiazem  30 mg used once as needed for palpitations, but generally not required - Eliquis  5 mg twice daily for anticoagulation - No bleeding or bruising complications from Eliquis   Peripheral neuropathy - Numbness in feet at night, especially without shoes - No interference with daily activities - Neurologist evaluation did not reveal concerning findings; advised against certain footwear  Cardiopulmonary symptoms - No chest pain - No shortness of breath - No other significant cardiac symptoms          ROS: Remaining review of systems negative  Studies Reviewed: SABRA   EKG Interpretation Date/Time:  Friday March 04 2024 08:52:36 EST Ventricular Rate:  70 PR Interval:  166 QRS Duration:  74 QT Interval:  390 QTC Calculation: 421 R Axis:   126  Text Interpretation: Normal sinus rhythm Possible Right ventricular hypertrophy Abnormal ECG When compared  with ECG of 18-Mar-2023 11:21, Nonspecific T wave abnormality no longer evident in Lateral leads Confirmed by Calender Emeline [phone]) on 03/04/2024 8:55:50 AM    Results Labs Lipid panel (03/23/2023): Total cholesterol 136, HDL 62, LDL 60, triglycerides 72  Radiology Coronary artery calcium  score (09/26/2021): Total score 401; left main 5, left anterior descending 193, left circumflex 197, right coronary artery 5; 83rd percentile for age  Diagnostic Zio Patch ambulatory cardiac monitor (08/27/2022): 13-day monitor; one triggered event for sinus rhythm; less than 1% atrial fibrillation burden; heart rate 50-139 bpm, average 72 bpm; rare ectopy Risk Assessment/Calculations:    CHA2DS2-VASc Score = 4   This indicates a 4.8% annual risk of stroke. The patient's score is based upon: CHF History: 0 HTN History: 1 Diabetes History: 0 Stroke History: 0 Vascular Disease History: 0 Age Score: 2 Gender Score: 1            Physical Exam:   VS:  BP 116/66   Pulse 69   Ht 5' 3 (1.6 m)   Wt 155 lb 9.6 oz (70.6 kg)   SpO2 97%   BMI 27.56 kg/m    Wt Readings from Last 3 Encounters:  03/04/24 155 lb 9.6 oz (70.6 kg)  06/29/23 157 lb (71.2 kg)  03/18/23 157 lb (71.2 kg)    GEN: Well nourished, well developed in no acute distress NECK: No JVD;  CARDIAC:  RRR, no murmurs, no rubs, no gallops RESPIRATORY:  Clear to auscultation without rales, wheezing or rhonchi  ABDOMEN: Soft, non-tender, non-distended EXTREMITIES:  No edema; No deformity   ASSESSMENT AND PLAN: .    Assessment  and Plan Assessment & Plan Paroxysmal atrial fibrillation Less than 1% burden on cardiac monitor in June 2024. Asymptomatic with no palpitations or symptoms. Alcohol and caffeine may trigger episodes. - Continue Eliquis  5 mg twice daily. - Use diltiazem  30 mg as needed for palpitations. - Ordered echocardiogram to assess cardiac function. - Advised to limit alcohol intake to reduce AFib triggers. - Instructed  to call 911 if experiencing chest pain, shortness of breath, or significant weight gain. - Instructed to contact office if AFib episodes become frequent or unresponsive to diltiazem .  Aortic atherosclerosis No acute symptoms or changes reported.  Hyperlipidemia Lipid levels are well-controlled. - Continue current lipid management regimen. - Will schedule lipid panel in one year.  Hypertension Controlled on hydrochlorothiazide 25 mg             Follow up: 1 year  Signed, Emeline FORBES Calender, DO  03/04/2024 9:17 AM    [facility] "

## 2024-04-05 ENCOUNTER — Ambulatory Visit (HOSPITAL_COMMUNITY)
